# Patient Record
Sex: Female | Born: 1955 | Race: Black or African American | Hispanic: No | Smoking: Former smoker
Health system: Southern US, Community
[De-identification: ages and names within clinical notes are randomized; demographics above are authoritative.]

## PROBLEM LIST (undated history)

## (undated) DIAGNOSIS — N62 Hypertrophy of breast: Secondary | ICD-10-CM

## (undated) DIAGNOSIS — F32A Depression, unspecified: Secondary | ICD-10-CM

## (undated) DIAGNOSIS — F329 Major depressive disorder, single episode, unspecified: Secondary | ICD-10-CM

## (undated) DIAGNOSIS — N950 Postmenopausal bleeding: Secondary | ICD-10-CM

## (undated) DIAGNOSIS — K449 Diaphragmatic hernia without obstruction or gangrene: Secondary | ICD-10-CM

## (undated) DIAGNOSIS — E669 Obesity, unspecified: Secondary | ICD-10-CM

## (undated) DIAGNOSIS — G4730 Sleep apnea, unspecified: Secondary | ICD-10-CM

## (undated) DIAGNOSIS — K219 Gastro-esophageal reflux disease without esophagitis: Secondary | ICD-10-CM

## (undated) HISTORY — PX: GASTRIC RESTRICTION SURGERY: SHX653

## (undated) HISTORY — PX: ENDOMETRIAL ABLATION: SHX621

## (undated) HISTORY — PX: LAPAROSCOPIC PARTIAL PROTECTOMY: SHX5912

## (undated) HISTORY — PX: NASAL SINUS SURGERY: SHX719

## (undated) HISTORY — PX: OTHER SURGICAL HISTORY: SHX169

## (undated) HISTORY — PX: BARIATRIC SURGERY: SHX1103

## (undated) HISTORY — PX: COLON SURGERY: SHX602

## (undated) HISTORY — PX: TUBAL LIGATION: SHX77

---

## 2004-07-29 ENCOUNTER — Ambulatory Visit: Payer: Self-pay | Admitting: Otolaryngology

## 2006-08-08 ENCOUNTER — Ambulatory Visit: Payer: Self-pay | Admitting: Nurse Practitioner

## 2006-11-21 ENCOUNTER — Ambulatory Visit: Payer: Self-pay | Admitting: Unknown Physician Specialty

## 2007-09-07 ENCOUNTER — Ambulatory Visit: Payer: Self-pay | Admitting: Nurse Practitioner

## 2008-09-09 ENCOUNTER — Ambulatory Visit: Payer: Self-pay | Admitting: Nurse Practitioner

## 2008-09-18 ENCOUNTER — Ambulatory Visit: Payer: Self-pay | Admitting: Nurse Practitioner

## 2008-10-02 ENCOUNTER — Ambulatory Visit: Payer: Self-pay | Admitting: Nurse Practitioner

## 2009-12-16 ENCOUNTER — Ambulatory Visit: Payer: Self-pay

## 2012-03-20 ENCOUNTER — Ambulatory Visit: Payer: Self-pay

## 2012-03-25 ENCOUNTER — Ambulatory Visit: Payer: Self-pay | Admitting: Family Medicine

## 2013-10-16 ENCOUNTER — Ambulatory Visit: Payer: Self-pay | Admitting: Family Medicine

## 2013-10-16 IMAGING — US US PELV - US TRANSVAGINAL
1 series · 13 of 25 positions shown · non-contrast
Comparison: None

CLINICAL DATA: Question intrauterine device.



[Series 1: us pelv - us transvaginal · 0.29mm/px · 13 of 66 slices shown]
[im 1/66]
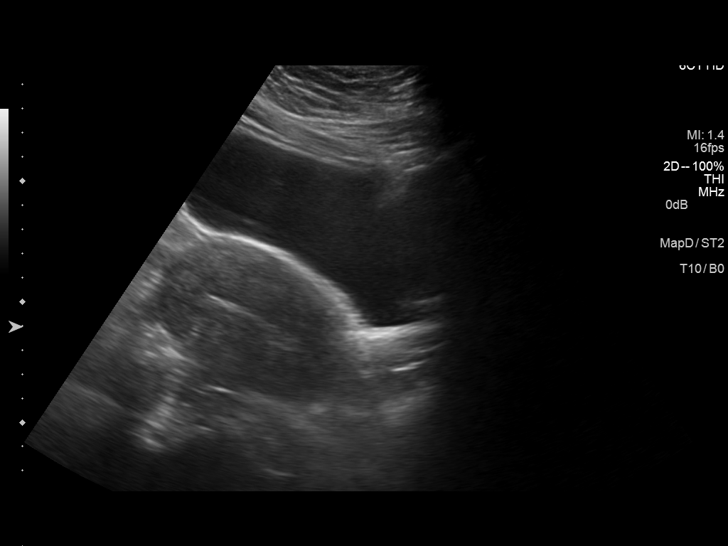
[im 6/66]
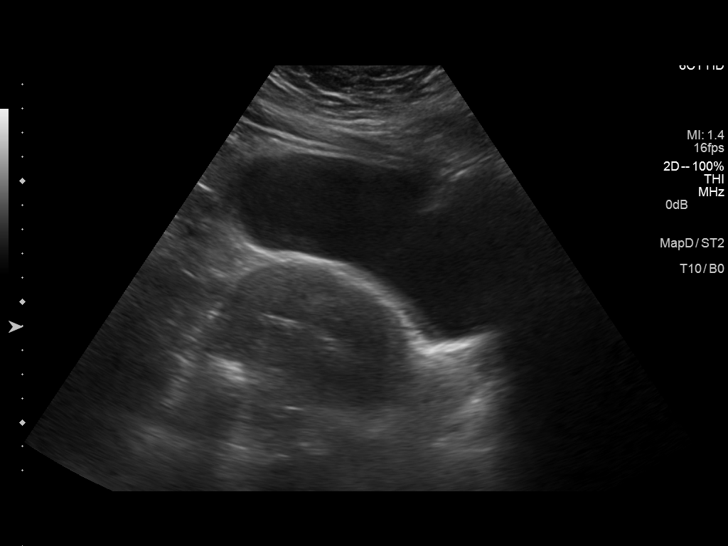
[im 11/66]
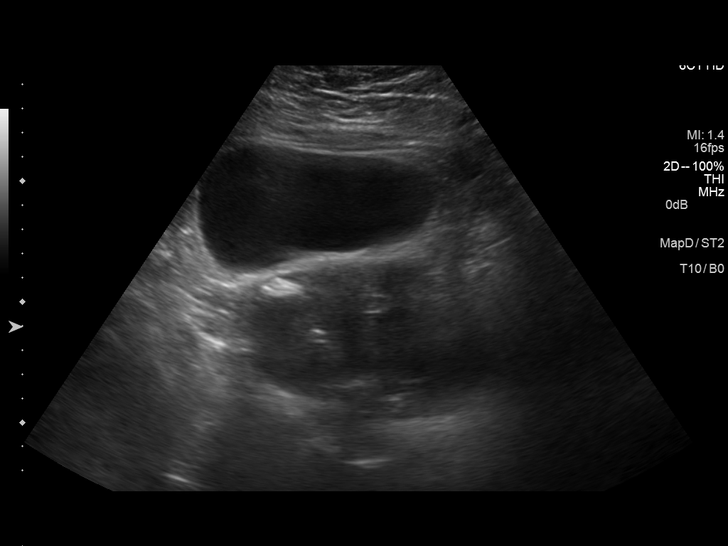
[im 17/66]
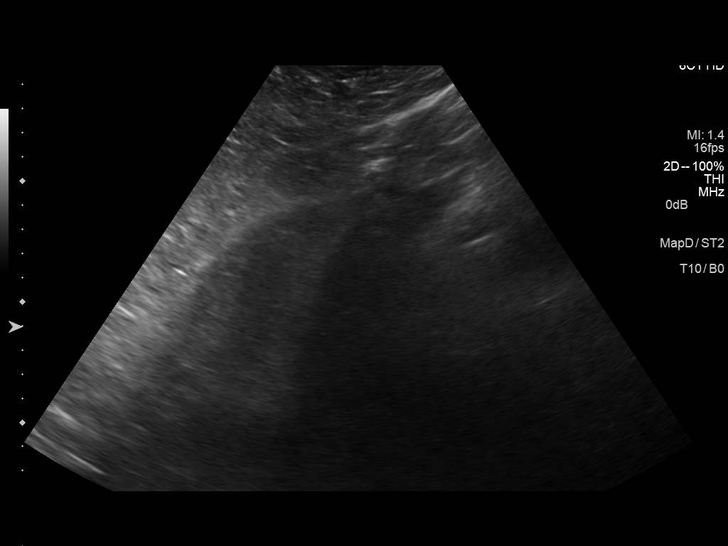
[im 22/66]
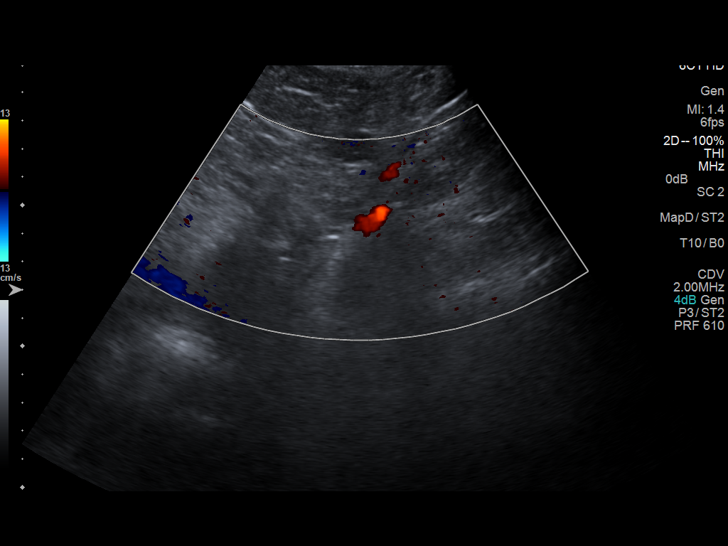
[im 28/66]
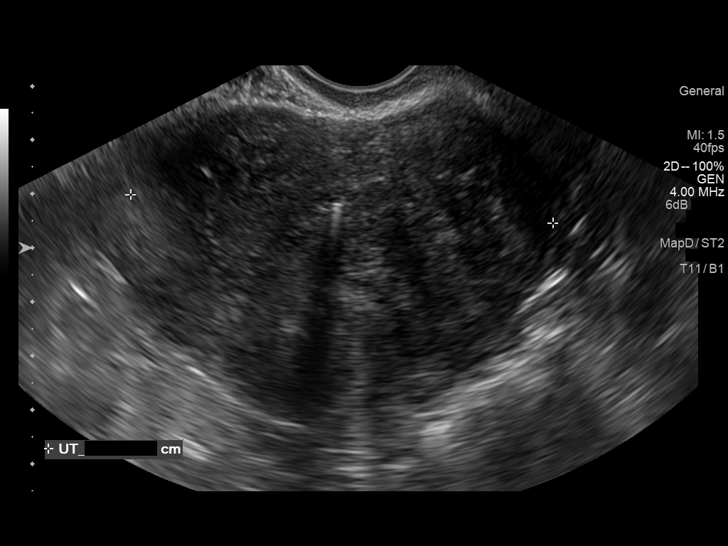
[im 33/66]
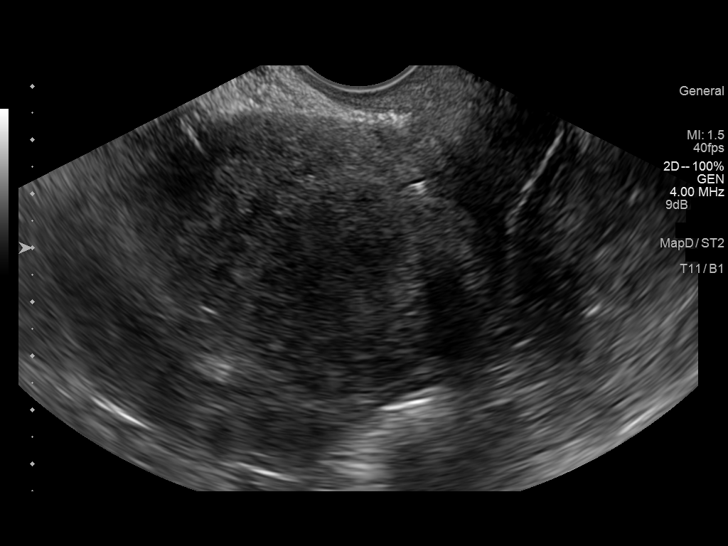
[im 38/66]
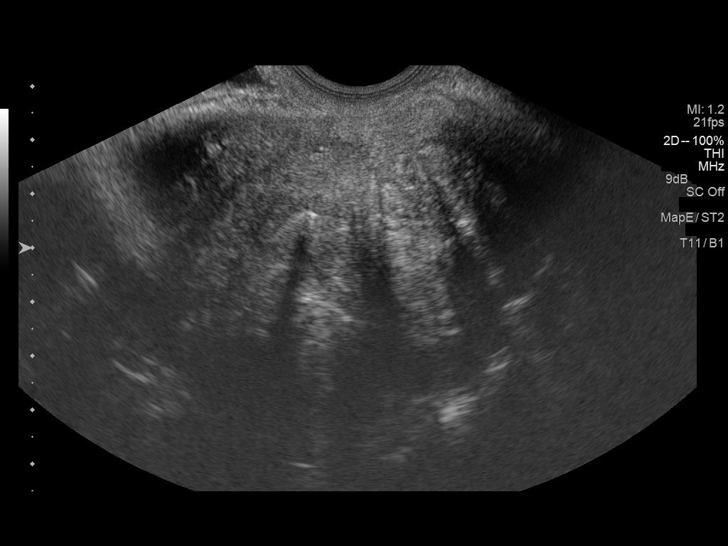
[im 44/66]
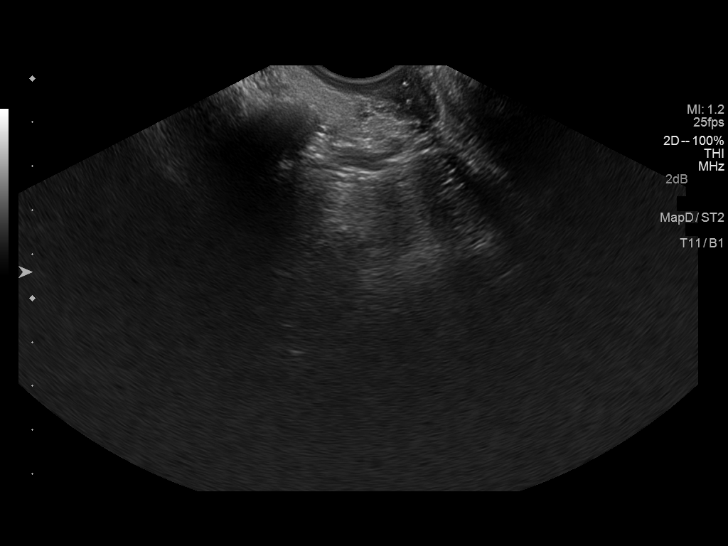
[im 49/66]
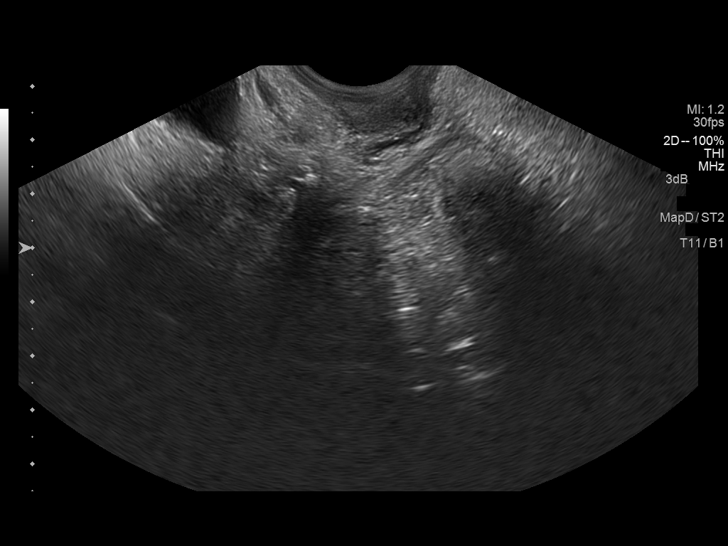
[im 55/66]
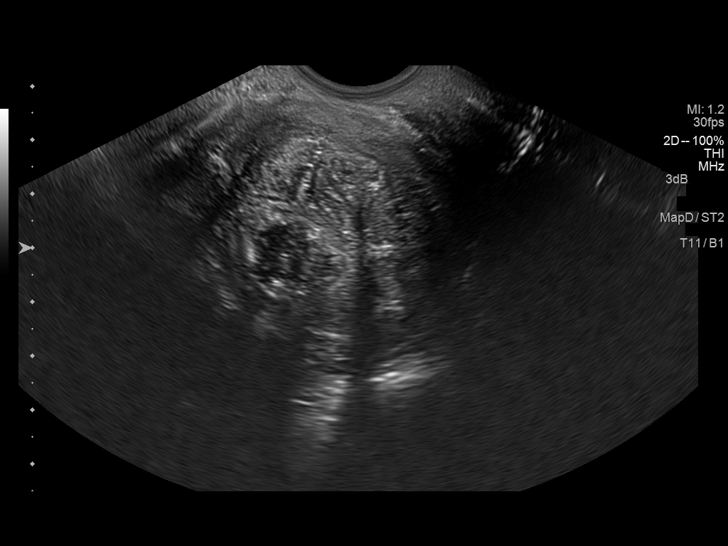
[im 60/66]
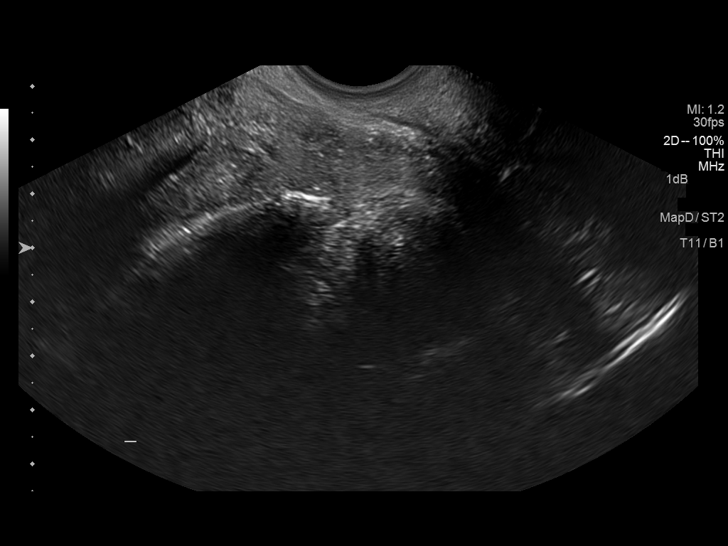
[im 66/66]
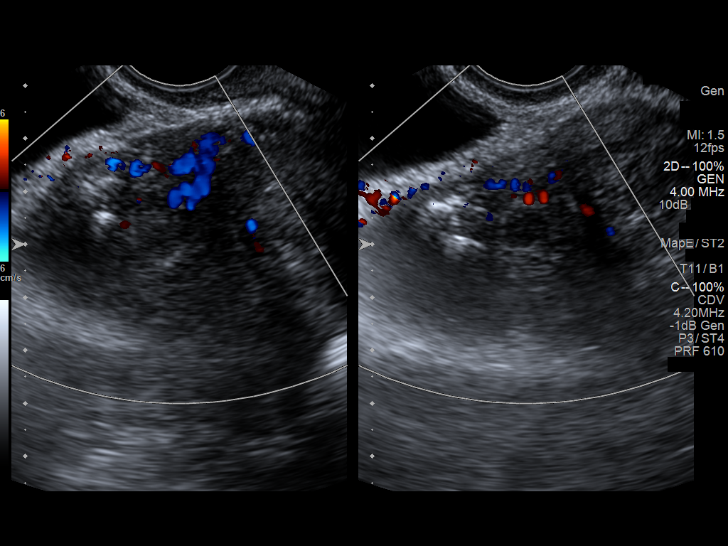

[13 of 25 positions shown; findings below may reference images not displayed]

FINDINGS: Uterus

Measurements: 10.2 x 6.3 x 7.8 cm. Within the superior left aspect
of the uterine body/ fundus there is a 3.8 x 2.9 x 2.8 cm mixed
echogenicity fibroid. Within the right aspect of the uterine fundus
there is a 1.4 x 1.1 x 0.9 cm mixed echogenicity fibroid, partially
calcified. Complicated nabothian cyst.

Endometrium

Thickness: 6.4 mm. Intrauterine device is visualized however unable
to assess for appropriate positioning secondary to large fundal
fibroids.

Right ovary

Not visualized

Left ovary

Not visualized

Other findings

No free fluid.
IMPRESSION: Intrauterine device is present. Limited evaluation due to body
habitus and multiple large fibroids. The superior portion of the
intrauterine device is not able to be visualized and therefore
unable to assess for correct positioning.

Fibroid uterus.

## 2013-10-26 ENCOUNTER — Ambulatory Visit: Payer: Self-pay | Admitting: Bariatrics

## 2013-10-26 IMAGING — CR DG CHEST 2V
1 series · 2 of 2 positions shown · non-contrast
Comparison: None.

CLINICAL DATA: Morbid obesity

EXAM:
CHEST  2 VIEW

[Series 1: dxr chest pa (or ap) and lateral · 0.14mm/px · 2 of 2 slices shown]
[im 1/2]
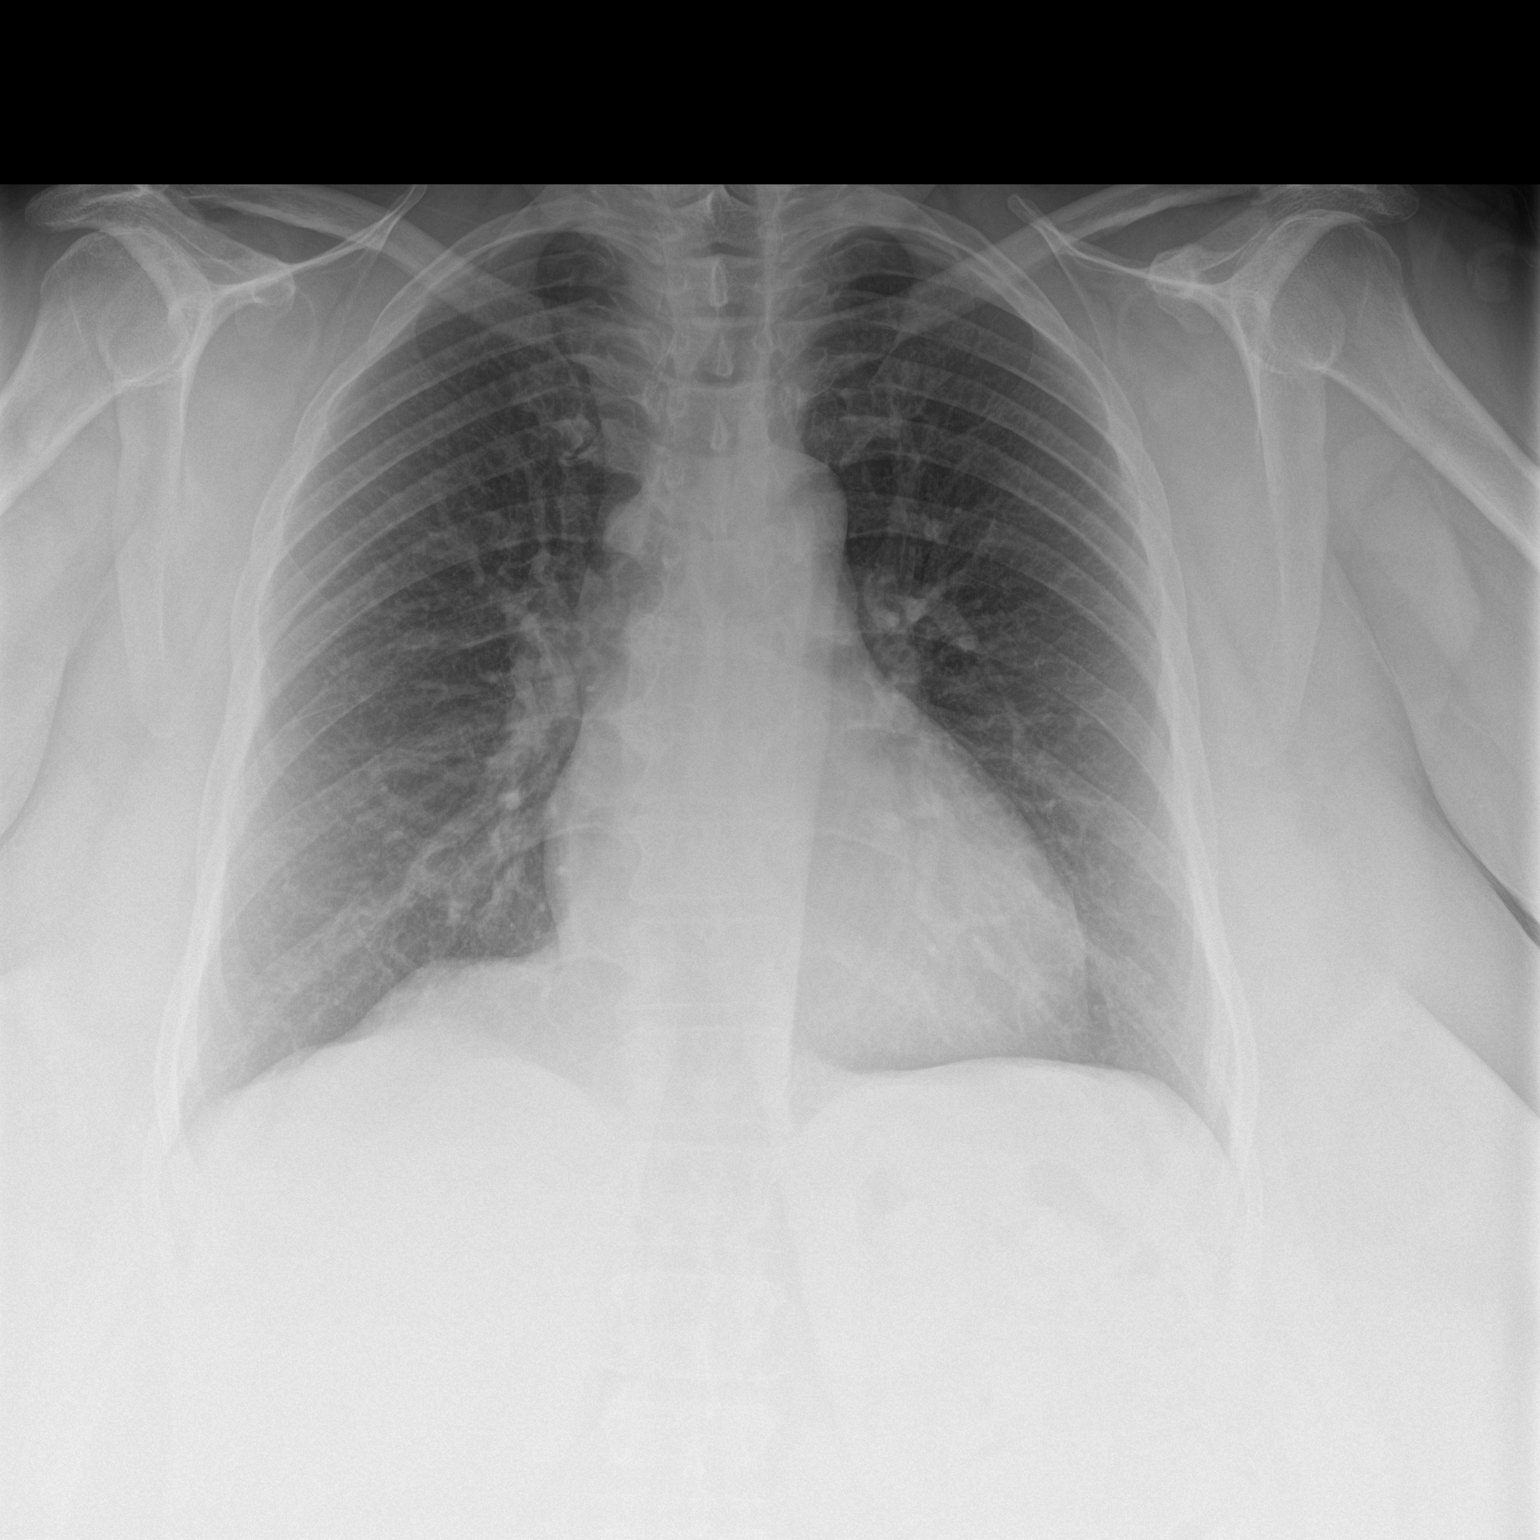
[im 2/2]
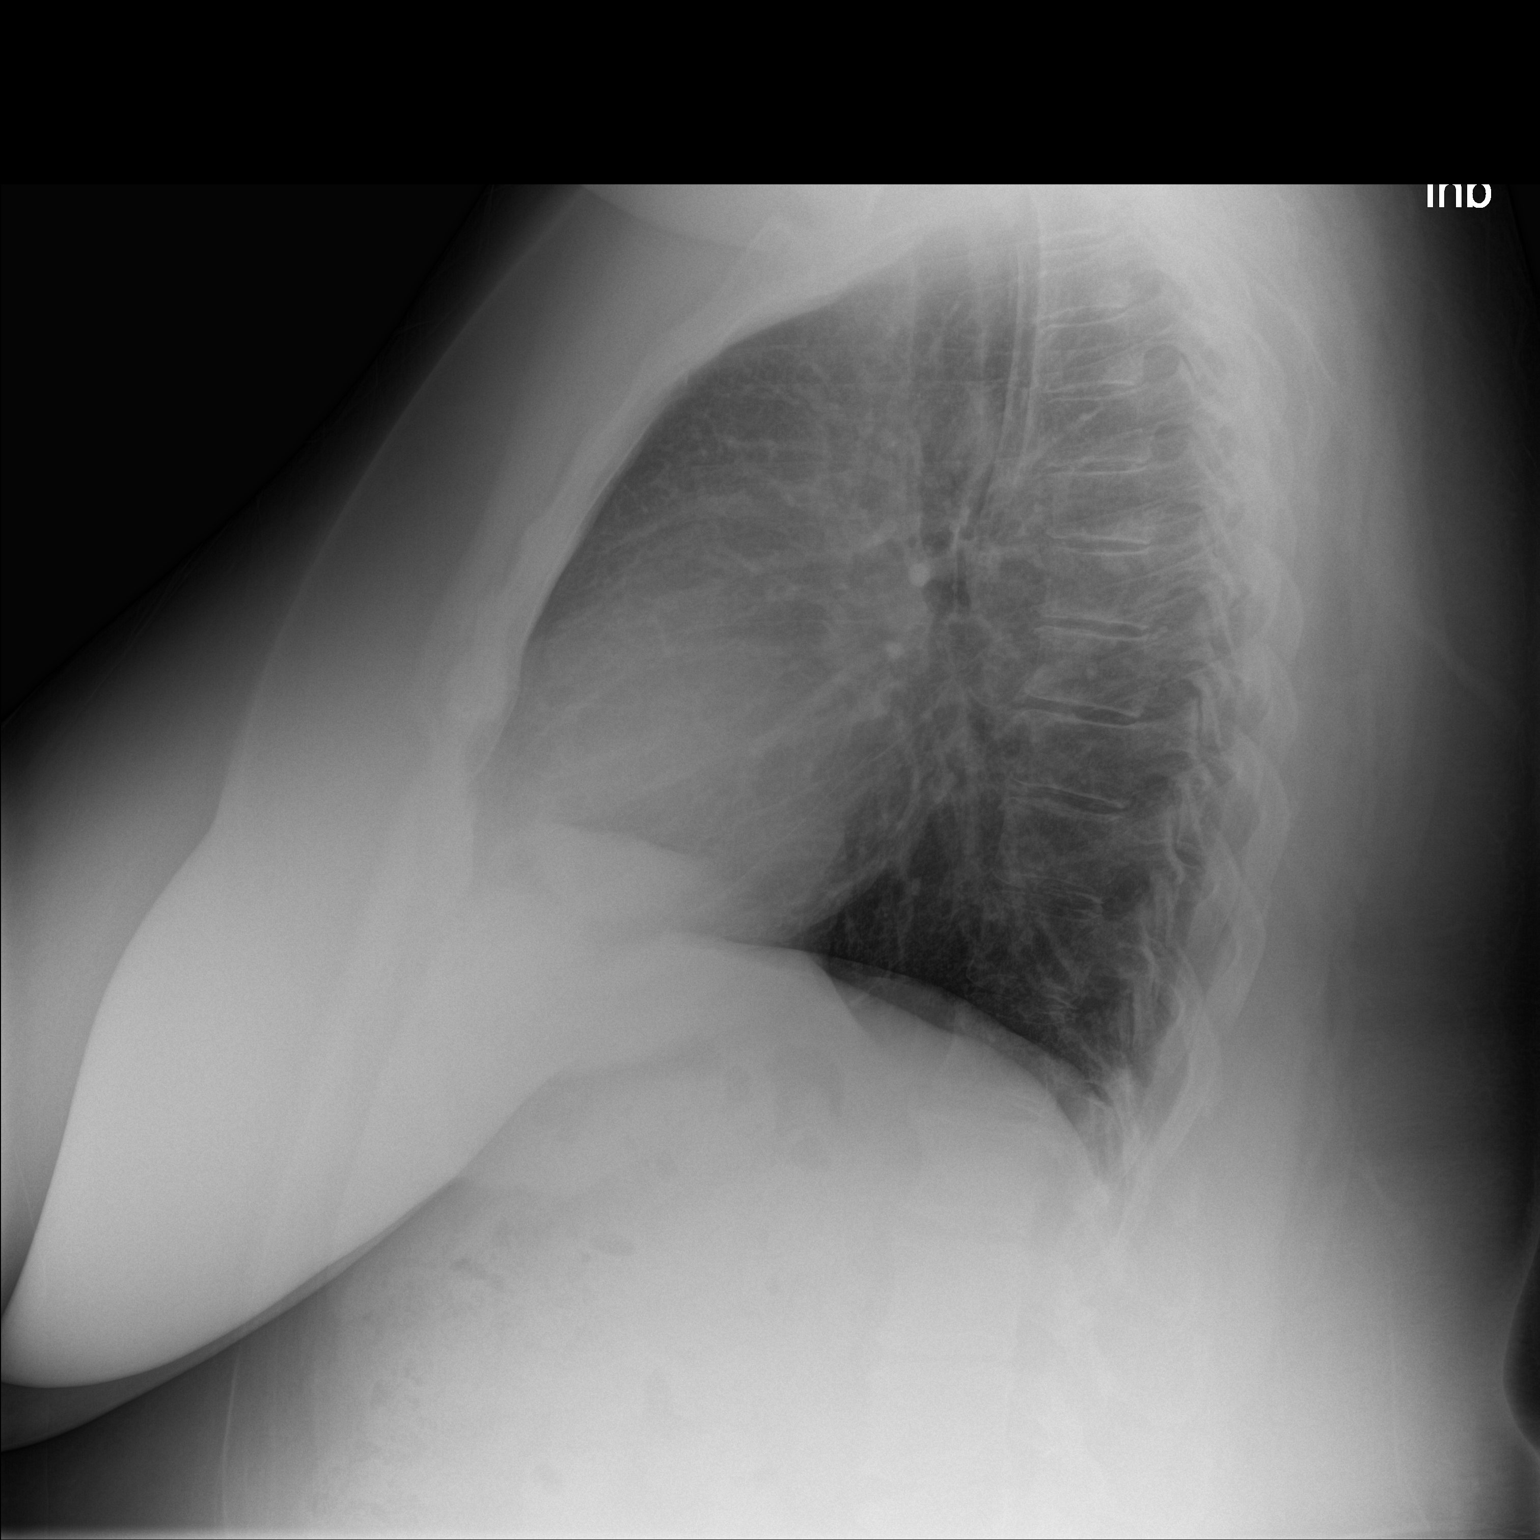

[2 of 2 positions shown; findings below may reference images not displayed]

FINDINGS: The lungs are well-expanded and clear. The heart and pulmonary
vascularity are normal. There is no pleural effusion. The
mediastinum is normal in width. The bony thorax is unremarkable.
IMPRESSION: There is no acute cardiopulmonary abnormality.

## 2013-11-06 ENCOUNTER — Ambulatory Visit: Payer: Self-pay | Admitting: Gastroenterology

## 2013-11-06 ENCOUNTER — Ambulatory Visit: Payer: Self-pay | Admitting: Bariatrics

## 2013-11-06 IMAGING — US ABDOMEN ULTRASOUND LIMITED
1 series · 14 of 25 positions shown · non-contrast
Comparison: None.

CLINICAL DATA: Morbid obesity.  Right upper quadrant pain.  GERD.

EXAM:
US ABDOMEN LIMITED - RIGHT UPPER QUADRANT

[Series 1: abdomen ultrasound limited · 0.27mm/px · 14 of 42 slices shown]
[im 1/42]
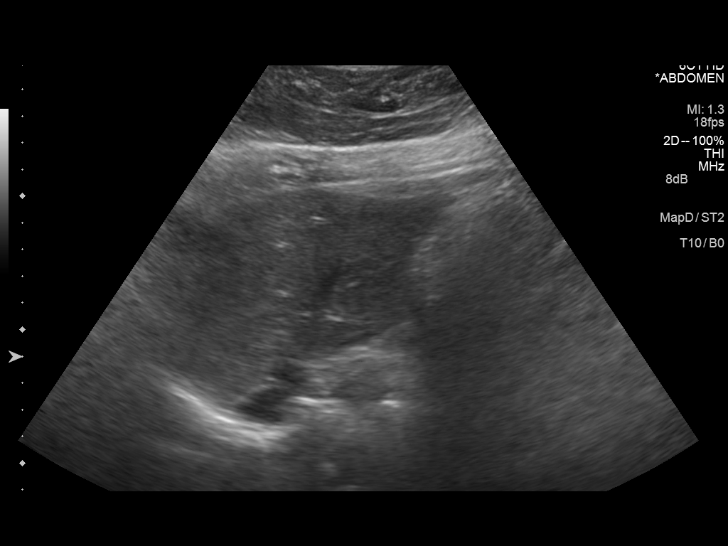
[im 4/42]
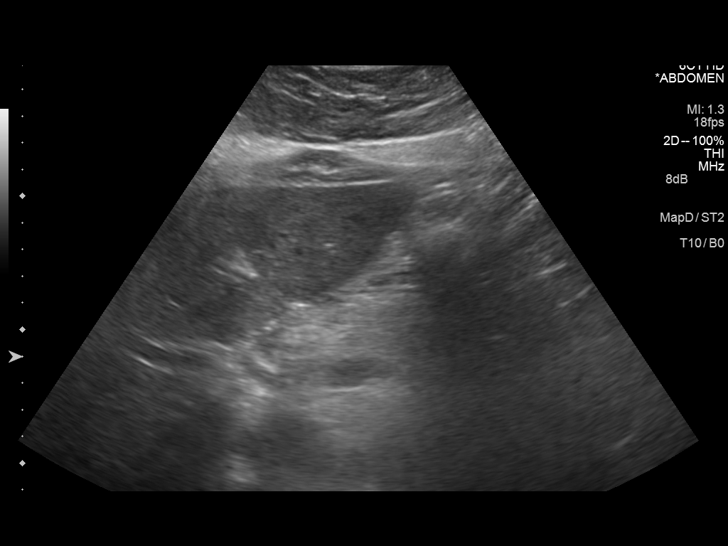
[im 7/42]
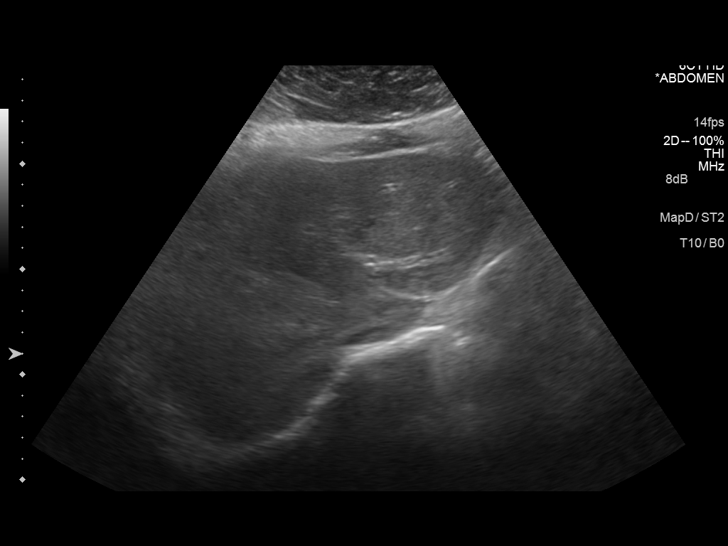
[im 11/42]
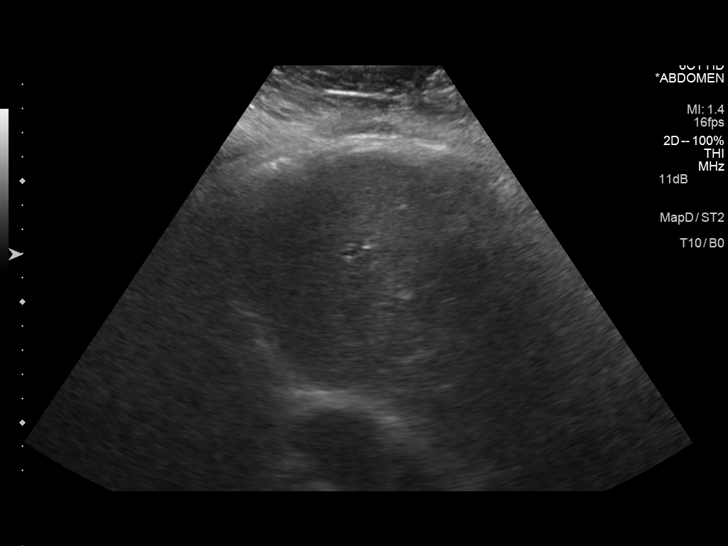
[im 14/42]
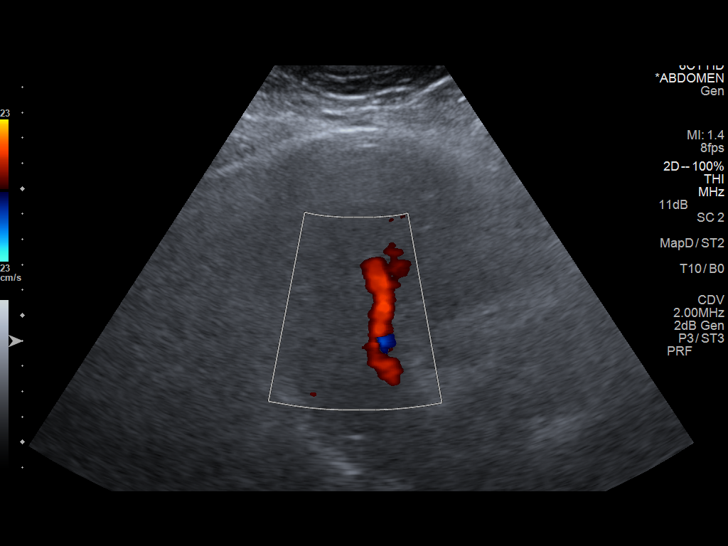
[im 16/42]
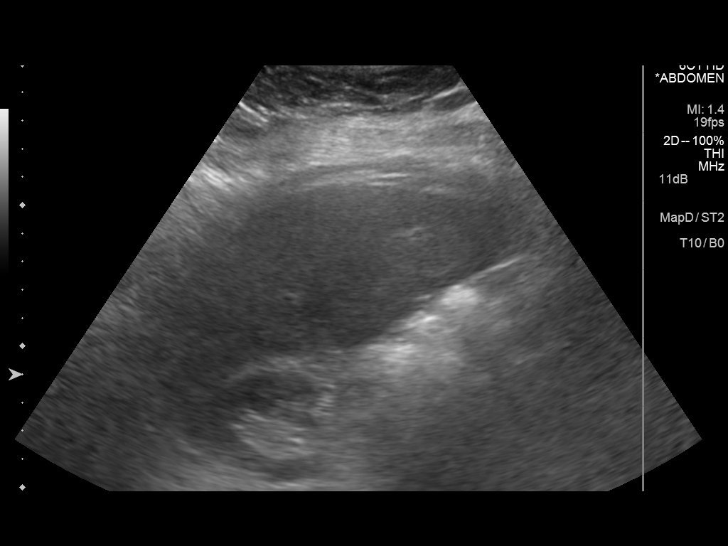
[im 19/42]
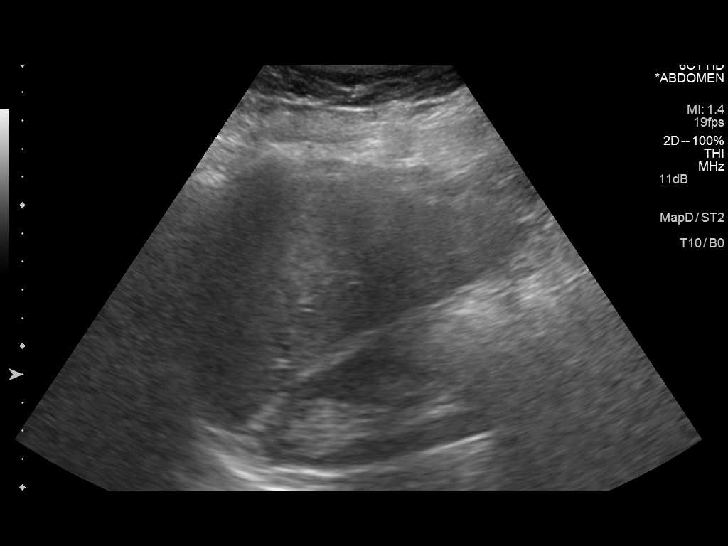
[im 23/42]
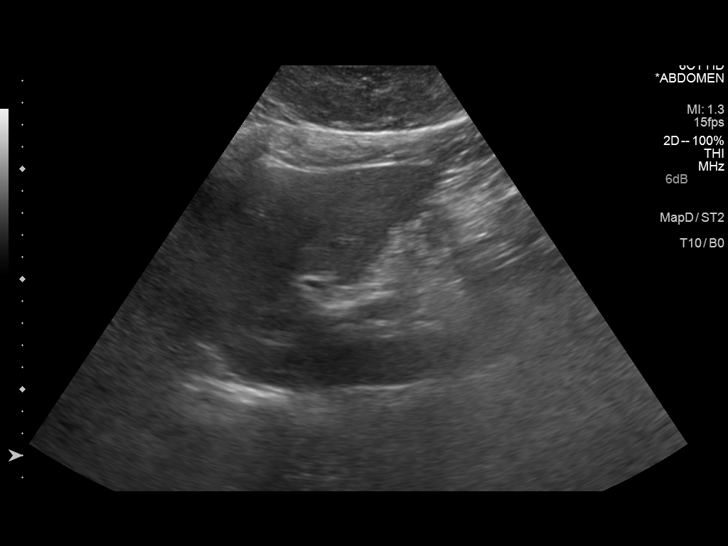
[im 26/42]
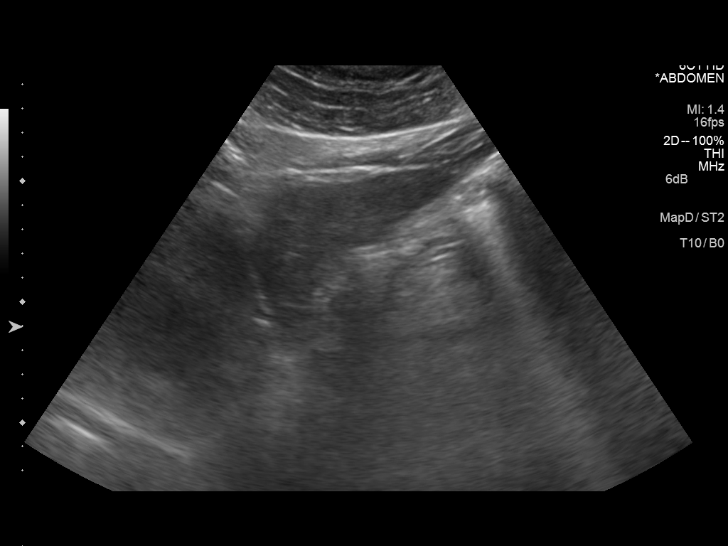
[im 28/42]
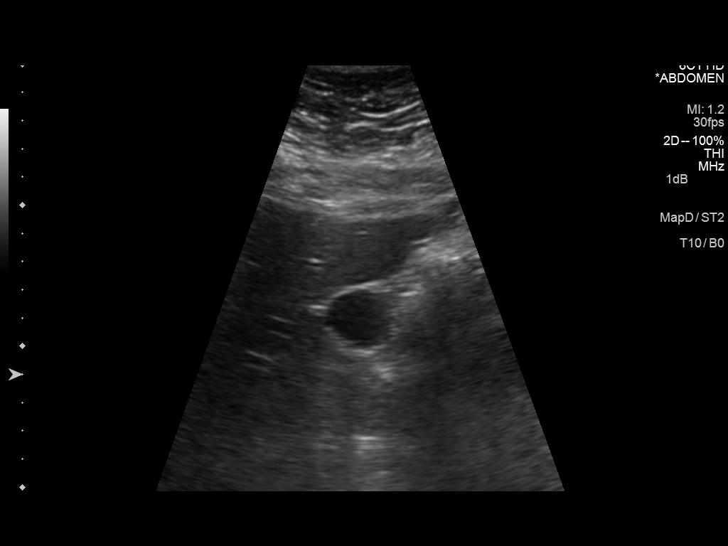
[im 31/42]
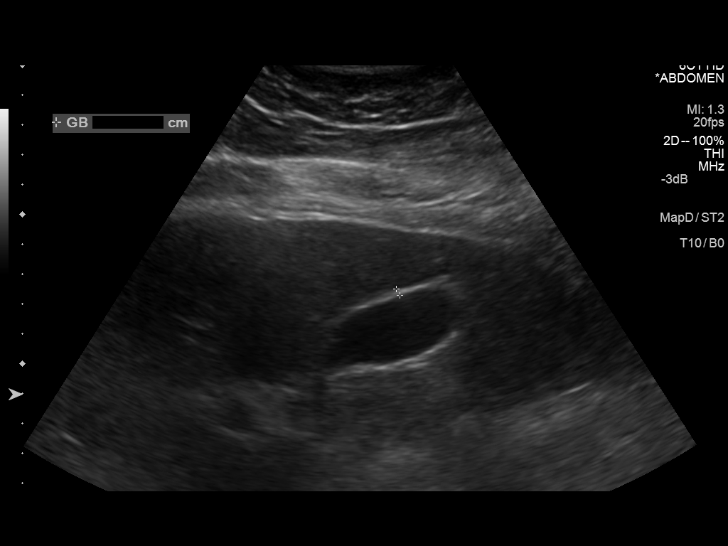
[im 35/42]
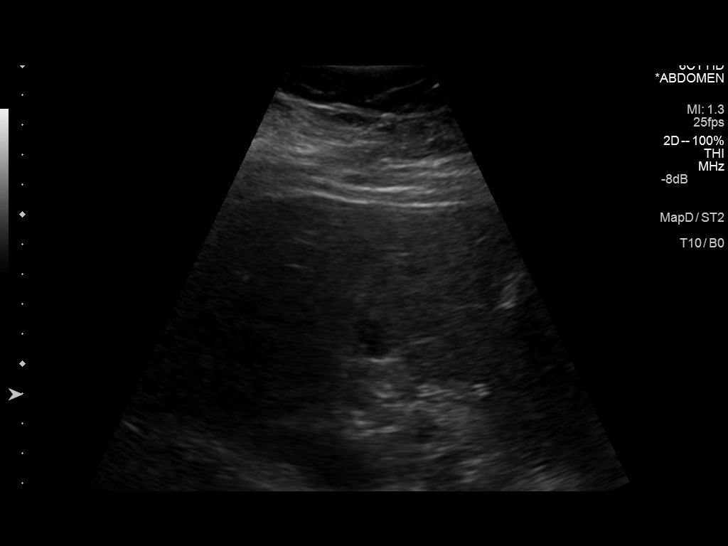
[im 38/42]
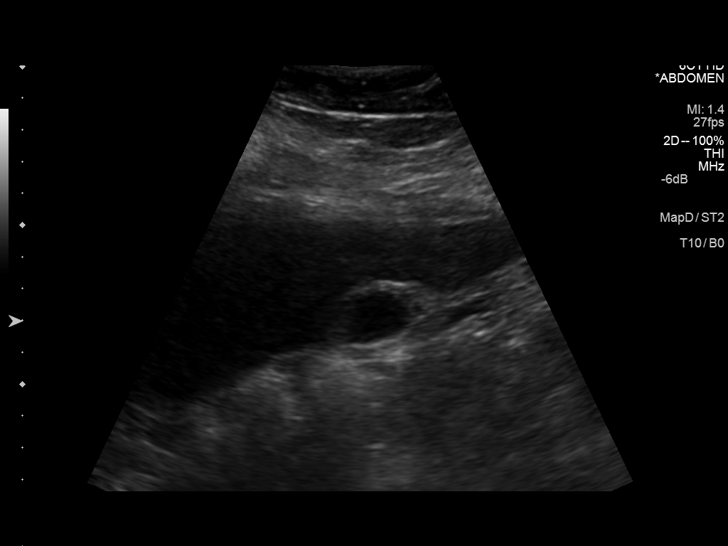
[im 42/42]
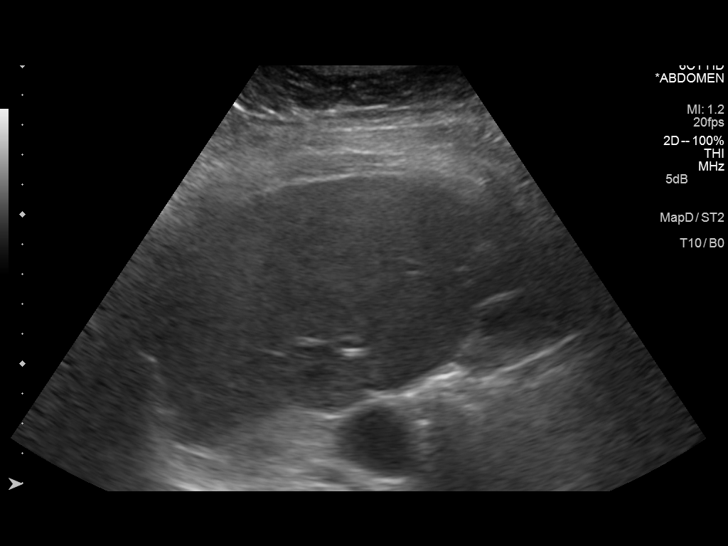

[14 of 25 positions shown; findings below may reference images not displayed]

FINDINGS: Gallbladder:

No gallstones or wall thickening visualized. No sonographic Murphy
sign noted.

Common bile duct:

Diameter: 2.5 mm.

Liver:

Mild hepatic steatosis without focal mass.
IMPRESSION: No acute hepatobiliary findings.

Mild hepatic steatosis.

## 2015-06-28 ENCOUNTER — Encounter: Payer: Self-pay | Admitting: Gynecology

## 2015-06-28 ENCOUNTER — Ambulatory Visit
Admission: EM | Admit: 2015-06-28 | Discharge: 2015-06-28 | Disposition: A | Discharge status: Home / Self Care | Payer: BLUE CROSS/BLUE SHIELD | Attending: Family Medicine | Admitting: Family Medicine

## 2015-06-28 DIAGNOSIS — J0100 Acute maxillary sinusitis, unspecified: Secondary | ICD-10-CM | POA: Diagnosis not present

## 2015-06-28 DIAGNOSIS — H6501 Acute serous otitis media, right ear: Secondary | ICD-10-CM | POA: Diagnosis not present

## 2015-06-28 HISTORY — DX: Gastro-esophageal reflux disease without esophagitis: K21.9

## 2015-06-28 HISTORY — DX: Depression, unspecified: F32.A

## 2015-06-28 HISTORY — DX: Obesity, unspecified: E66.9

## 2015-06-28 HISTORY — DX: Postmenopausal bleeding: N95.0

## 2015-06-28 HISTORY — DX: Sleep apnea, unspecified: G47.30

## 2015-06-28 HISTORY — DX: Major depressive disorder, single episode, unspecified: F32.9

## 2015-06-28 HISTORY — DX: Hypertrophy of breast: N62

## 2015-06-28 HISTORY — DX: Diaphragmatic hernia without obstruction or gangrene: K44.9

## 2015-06-28 MED ORDER — AMOXICILLIN 875 MG PO TABS: 875.0000 mg | ORAL_TABLET | Freq: Two times a day (BID) | ORAL | Status: DC

## 2015-06-28 NOTE — Discharge Instructions (Signed)

## 2015-06-28 NOTE — ED Provider Notes (Signed)
CSN: [id number]     Arrival date & time 06/28/15  1258 History   First MD Initiated Contact with Patient 06/28/15 1345     Chief Complaint  Patient presents with  . URI   (Consider location/radiation/quality/duration/timing/severity/associated sxs/prior Treatment) Patient is a 60 y.o. female presenting with URI. The history is provided by the patient.  URI Presenting symptoms: cough, ear pain, fatigue and fever   Severity:  Moderate Onset quality:  Sudden Duration:  2 weeks Timing:  Constant Progression:  Unchanged Chronicity:  New Ineffective treatments:  OTC medications Associated symptoms: headaches and sinus pain   Associated symptoms: no neck pain, no swollen glands and no wheezing   Risk factors: sick contacts   Risk factors: not elderly, no chronic cardiac disease, no chronic kidney disease, no chronic respiratory disease, no diabetes mellitus, no immunosuppression, no recent illness and no recent travel     Past Medical History  Diagnosis Date  . Macromastia   . Obesity   . Depressive disorder   . Post-menopausal bleeding   . Sleep apnea   . GERD (gastroesophageal reflux disease)   . Hiatal hernia    Past Surgical History  Procedure Laterality Date  . Buionectomy    . Nasal sinus surgery    . Endometrial ablation    . Colon surgery    . Uge    . Tubal ligation    . Laparoscopic partial protectomy    . Gastric restriction surgery    . Gastectomy    . Bariatric surgery     No family history on file. Social History  Substance Use Topics  . Smoking status: Former Research scientist (life sciences)  . Smokeless tobacco: None  . Alcohol Use: Yes   OB History    No data available     Review of Systems  Constitutional: Positive for fever and fatigue.  HENT: Positive for ear pain.   Respiratory: Positive for cough. Negative for wheezing.   Musculoskeletal: Negative for neck pain.  Neurological: Positive for headaches.    Allergies  Latex and Nickel  Home Medications   Prior  to Admission medications   Medication Sig Start Date End Date Taking? Authorizing Provider  ALPRAZolam Duanne Moron) 0.5 MG tablet Take 0.5 mg by mouth at bedtime as needed for anxiety.   Yes Historical Provider, MD  citalopram (CELEXA) 20 MG tablet Take 20 mg by mouth daily.   Yes Historical Provider, MD  fluticasone (FLONASE) 50 MCG/ACT nasal spray Place into both nostrils daily.   Yes Historical Provider, MD  amoxicillin (AMOXIL) 875 MG tablet Take 1 tablet (875 mg total) by mouth 2 (two) times daily. 06/28/15   Norval Gable, MD   Meds Ordered and Administered this Visit  Medications - No data to display  BP 118/73 mmHg  Pulse 64  Temp(Src) 98.7 F (37.1 C) (Oral)  Resp 18  Ht 5\' 4"  (1.626 m)  Wt 230 lb (104.327 kg)  BMI 39.46 kg/m2  SpO2 99% No data found.   Physical Exam  Constitutional: She appears well-developed and well-nourished. No distress.  HENT:  Head: Normocephalic and atraumatic.  Right Ear: External ear and ear canal normal. Tympanic membrane is injected, erythematous and bulging.  Left Ear: Tympanic membrane, external ear and ear canal normal.  Nose: Mucosal edema and rhinorrhea present. No nose lacerations, sinus tenderness, nasal deformity, septal deviation or nasal septal hematoma. No epistaxis.  No foreign bodies. Right sinus exhibits maxillary sinus tenderness and frontal sinus tenderness. Left sinus exhibits maxillary sinus tenderness  and frontal sinus tenderness.  Mouth/Throat: Uvula is midline, oropharynx is clear and moist and mucous membranes are normal. No oropharyngeal exudate.  Eyes: Conjunctivae and EOM are normal. Pupils are equal, round, and reactive to light. Right eye exhibits no discharge. Left eye exhibits no discharge. No scleral icterus.  Neck: Normal range of motion. Neck supple. No thyromegaly present.  Cardiovascular: Normal rate, regular rhythm and normal heart sounds.   Pulmonary/Chest: Effort normal and breath sounds normal. No respiratory  distress. She has no wheezes. She has no rales.  Lymphadenopathy:    She has no cervical adenopathy.  Skin: She is not diaphoretic.  Nursing note and vitals reviewed.   ED Course  Procedures (including critical care time)  Labs Review Labs Reviewed - No data to display  Imaging Review No results found.   Visual Acuity Review  Right Eye Distance:   Left Eye Distance:   Bilateral Distance:    Right Eye Near:   Left Eye Near:    Bilateral Near:         MDM   1. Acute maxillary sinusitis, recurrence not specified   2. Right acute serous otitis media, recurrence not specified    Discharge Medication List as of 06/28/2015  2:09 PM    START taking these medications   Details  amoxicillin (AMOXIL) 875 MG tablet Take 1 tablet (875 mg total) by mouth 2 (two) times daily., Starting 06/28/2015, Until Discontinued, Normal       1. diagnosis reviewed with patient 2. rx as per orders above; reviewed possible side effects, interactions, risks and benefits  3. Recommend supportive treatment with otc analgesics prn, increased fluids 4. Follow-up prn if symptoms worsen or don't improve    Norval Gable, MD 06/28/15 1423

## 2015-06-28 NOTE — ED Notes (Signed)
Patient c/o cough / drainage down to throat / greenish mucous on and off x 2 weeks.

## 2017-06-10 ENCOUNTER — Ambulatory Visit
Admission: EM | Admit: 2017-06-10 | Discharge: 2017-06-10 | Disposition: A | Discharge status: Home / Self Care | Payer: BLUE CROSS/BLUE SHIELD | Attending: Family Medicine | Admitting: Family Medicine

## 2017-06-10 ENCOUNTER — Encounter: Payer: Self-pay | Admitting: Emergency Medicine

## 2017-06-10 ENCOUNTER — Other Ambulatory Visit: Payer: Self-pay

## 2017-06-10 ENCOUNTER — Ambulatory Visit (INDEPENDENT_AMBULATORY_CARE_PROVIDER_SITE_OTHER): Payer: BLUE CROSS/BLUE SHIELD

## 2017-06-10 DIAGNOSIS — M25562 Pain in left knee: Secondary | ICD-10-CM

## 2017-06-10 DIAGNOSIS — S62346A Nondisplaced fracture of base of fifth metacarpal bone, right hand, initial encounter for closed fracture: Secondary | ICD-10-CM

## 2017-06-10 DIAGNOSIS — W1831XA Fall on same level due to stepping on an object, initial encounter: Secondary | ICD-10-CM | POA: Diagnosis not present

## 2017-06-10 DIAGNOSIS — M25511 Pain in right shoulder: Secondary | ICD-10-CM

## 2017-06-10 DIAGNOSIS — S62366A Nondisplaced fracture of neck of fifth metacarpal bone, right hand, initial encounter for closed fracture: Secondary | ICD-10-CM

## 2017-06-10 DIAGNOSIS — M79641 Pain in right hand: Secondary | ICD-10-CM

## 2017-06-10 DIAGNOSIS — S62616A Displaced fracture of proximal phalanx of right little finger, initial encounter for closed fracture: Secondary | ICD-10-CM

## 2017-06-10 DIAGNOSIS — M25531 Pain in right wrist: Secondary | ICD-10-CM

## 2017-06-10 IMAGING — CR DG HAND COMPLETE 3+V*R*
3 series · 3 of 3 positions shown · non-contrast
Comparison: None.

CLINICAL DATA: Acute RIGHT hand pain following fall today. Initial
encounter.

EXAM:
RIGHT HAND - COMPLETE 3+ VIEW

[hand ap]
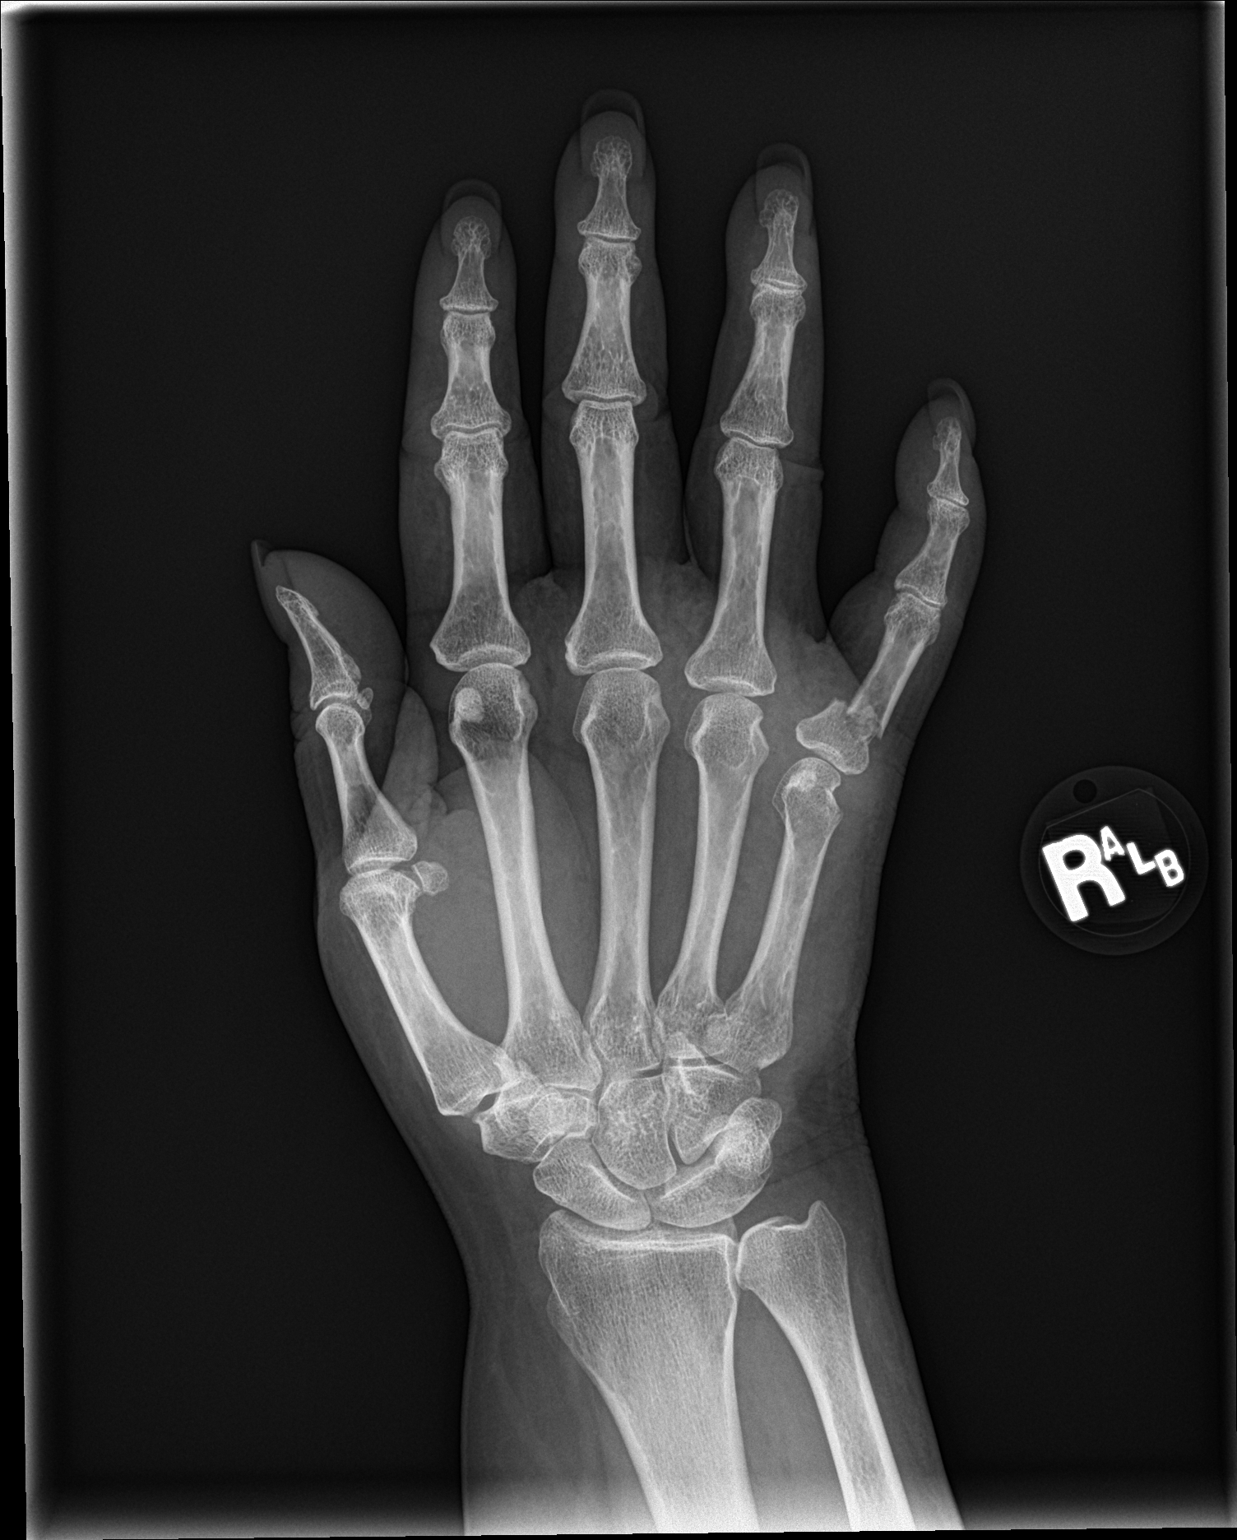

[hand obl]
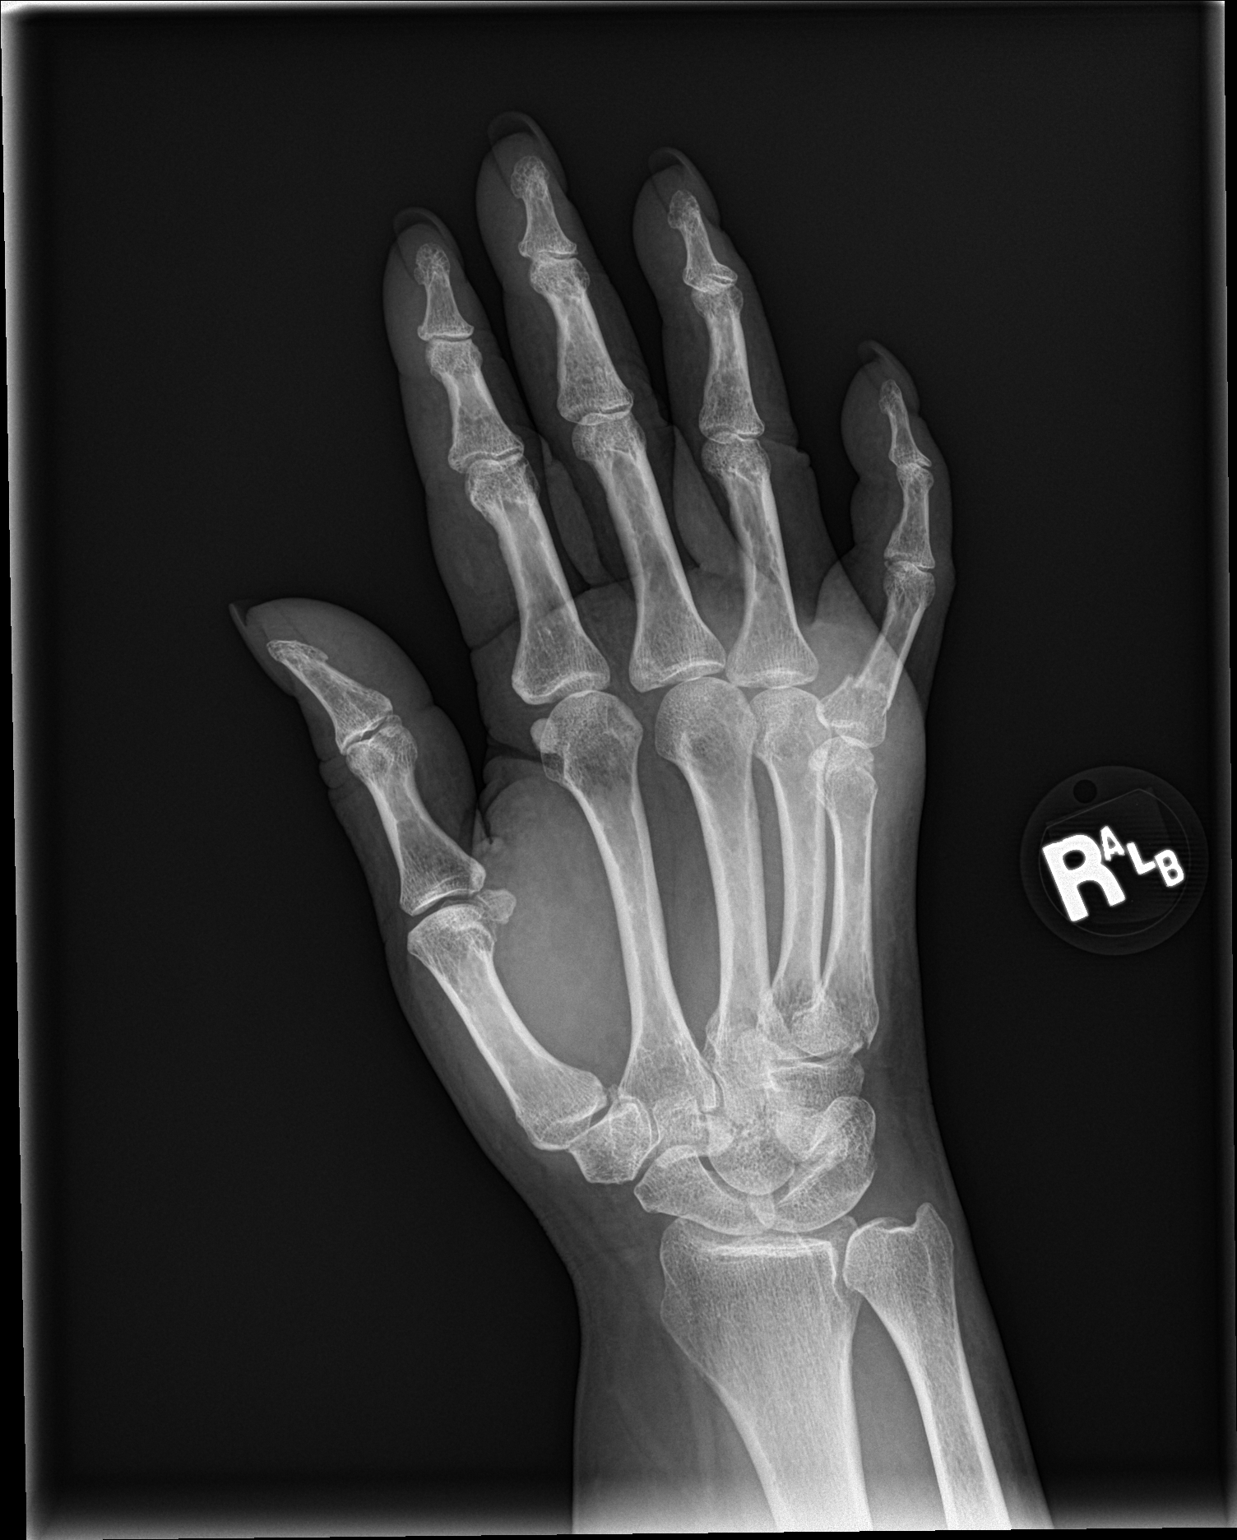

[hand lat]
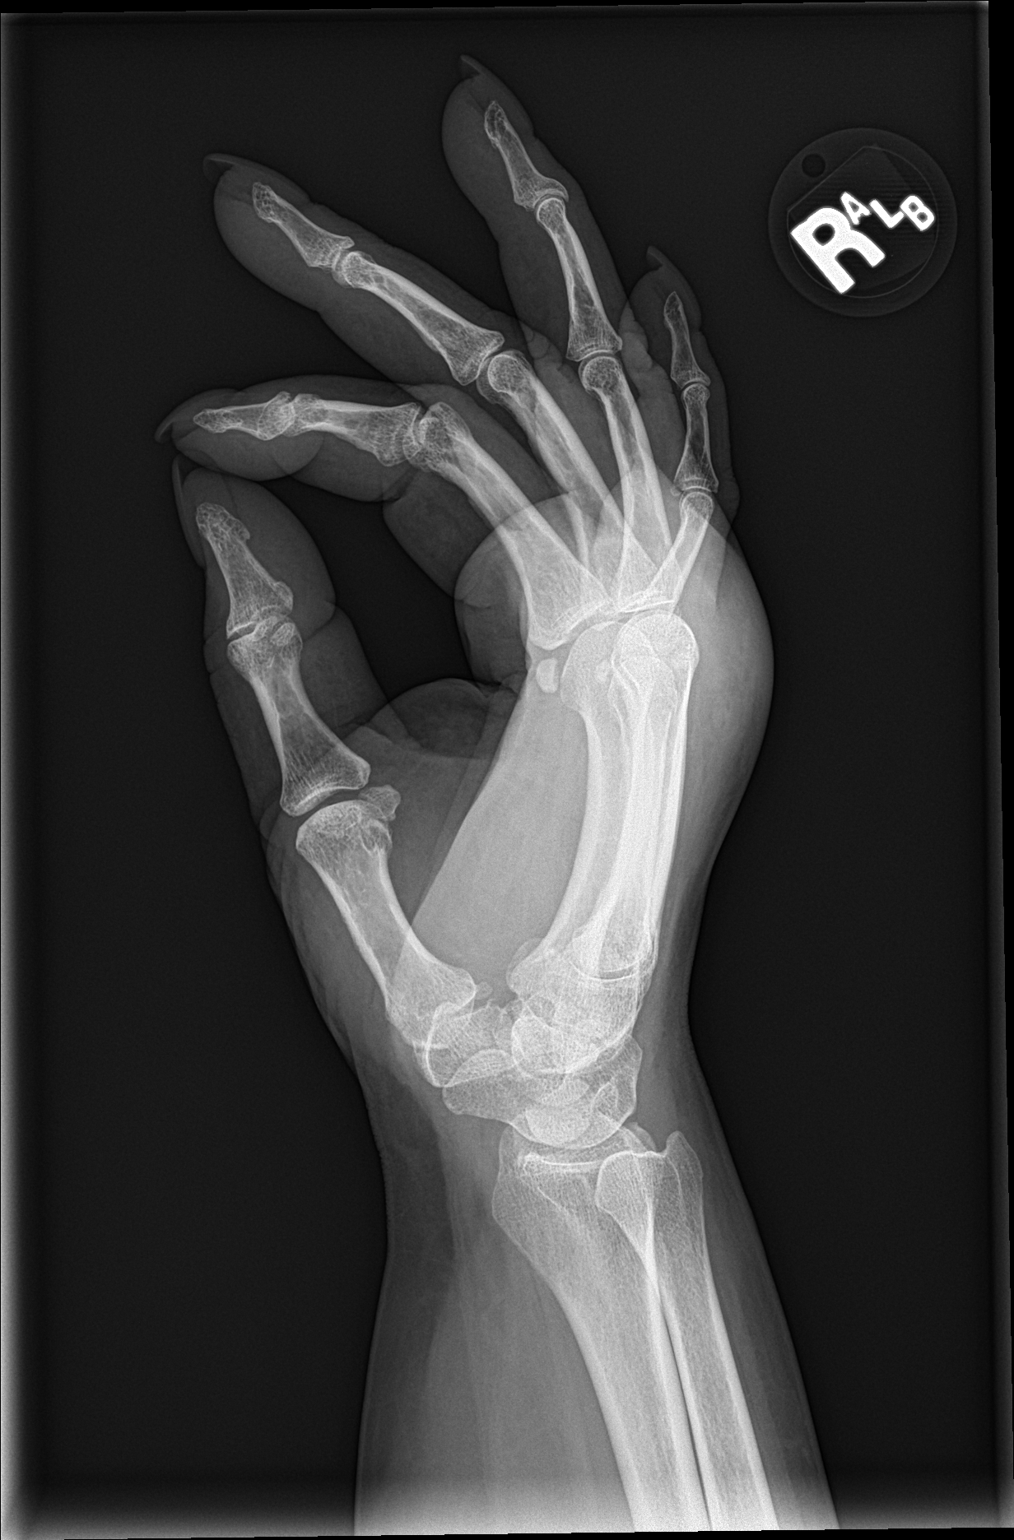

[3 of 3 positions shown; findings below may reference images not displayed]

FINDINGS: A transverse fracture of the proximal aspect of the little finger
proximal phalanx noted with equivocal extension to the MCP joint. 2
mm ulnar displacement noted.

Dorsal soft tissue swelling noted.

An equivocal fracture of the fifth metacarpal head noted.

No subluxation or dislocation noted.

Dorsal soft tissue swelling is present.
IMPRESSION: Fracture of the little finger proximal phalanx as described and
probable fracture of the fifth metacarpal head.

## 2017-06-10 IMAGING — CR DG SHOULDER 2+V*R*
3 series · 4 of 4 positions shown · non-contrast
Comparison: None.

CLINICAL DATA: Acute RIGHT shoulder pain following fall. Initial
encounter.

EXAM:
RIGHT SHOULDER - 2+ VIEW

[Series 1: shoulder grashey · 0.14mm/px · 2 of 2 slices shown]
[im 1/2]
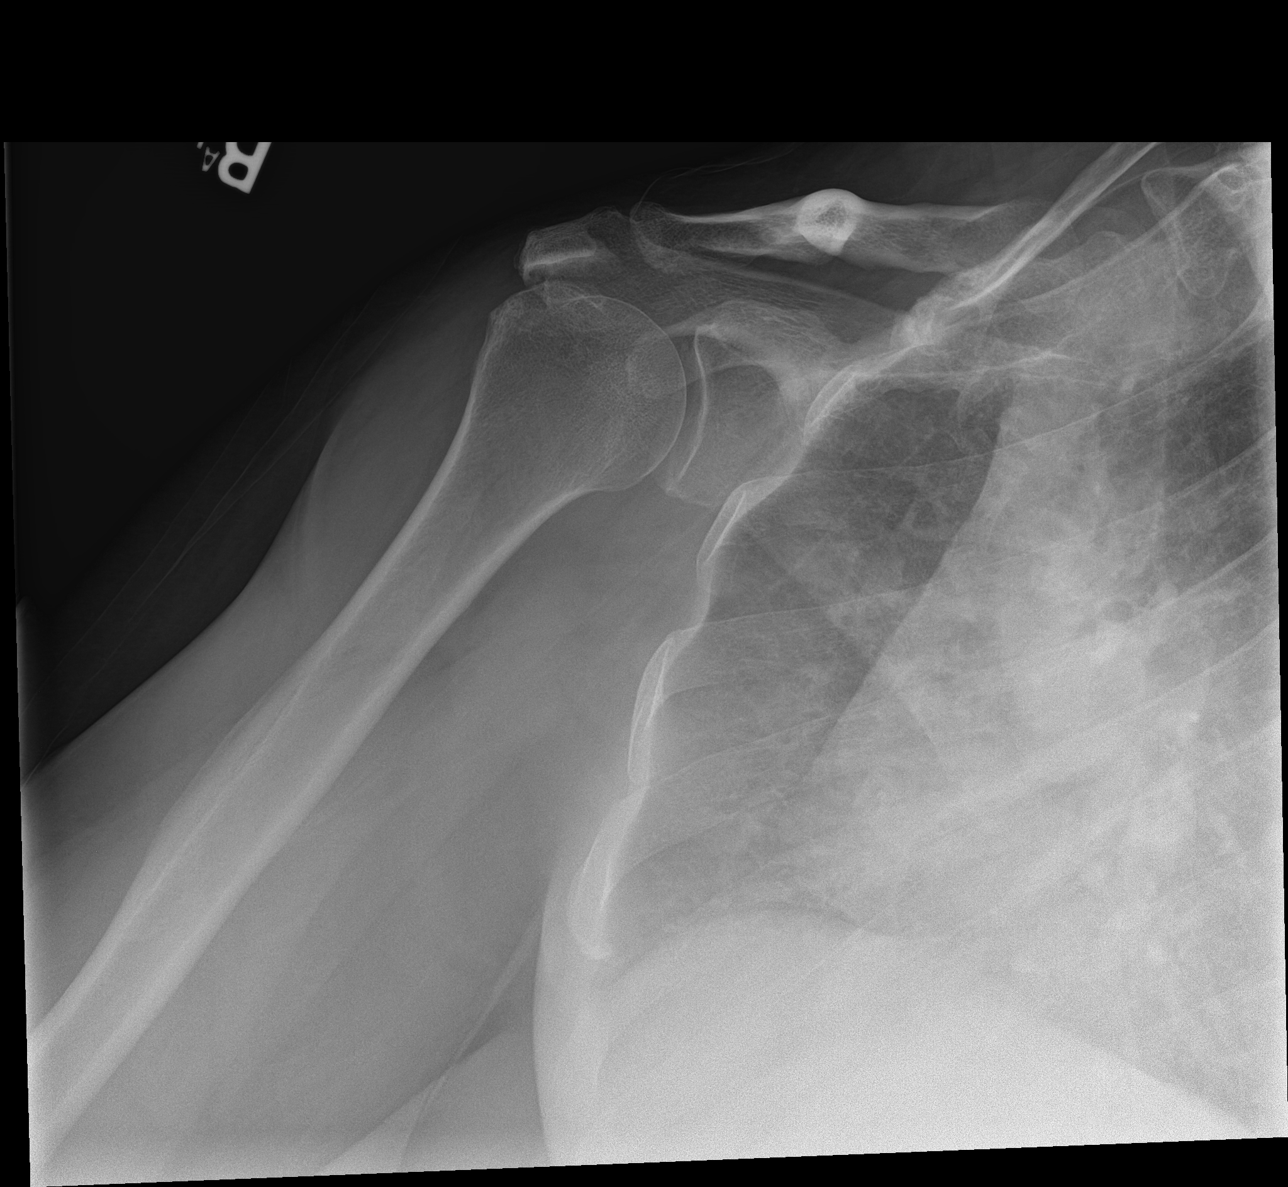
[im 2/2]
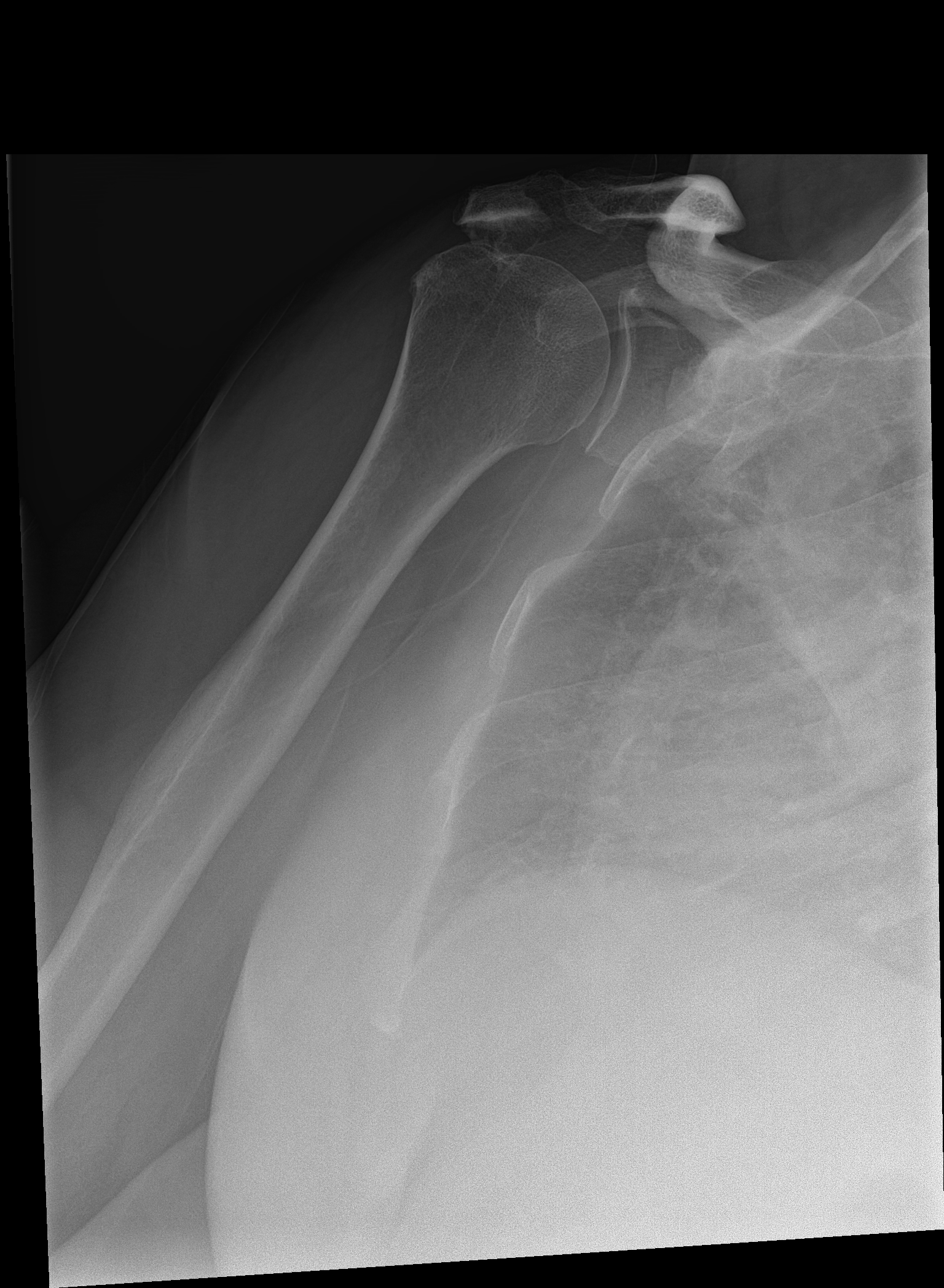

[shoulder y view]
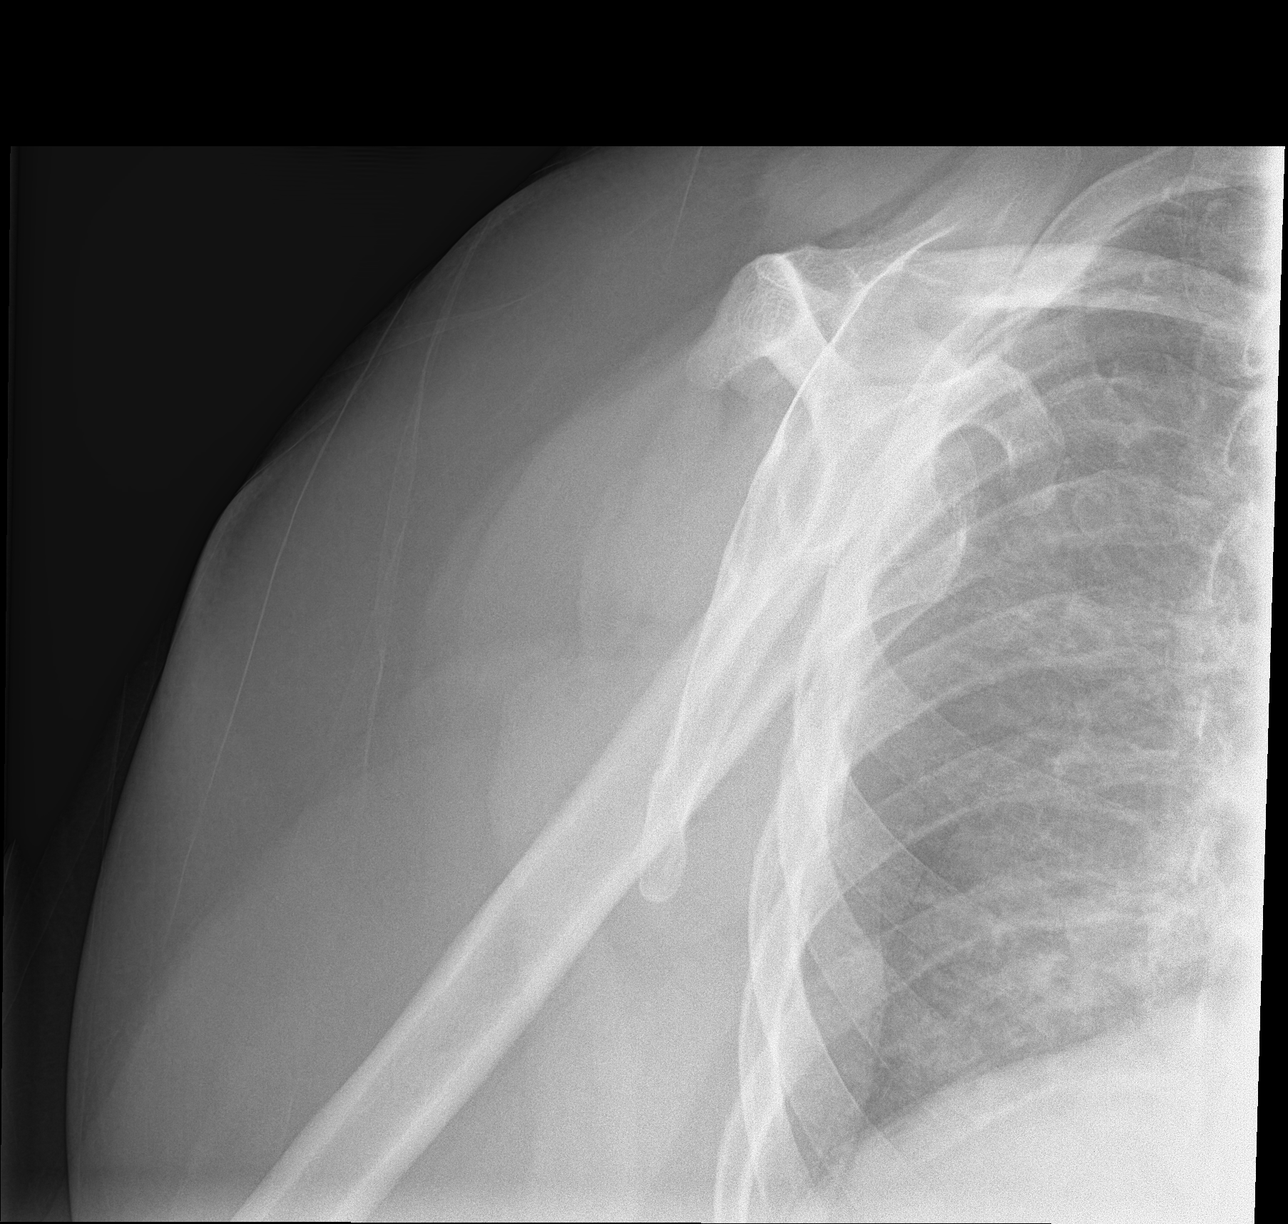

[shoulder axial]
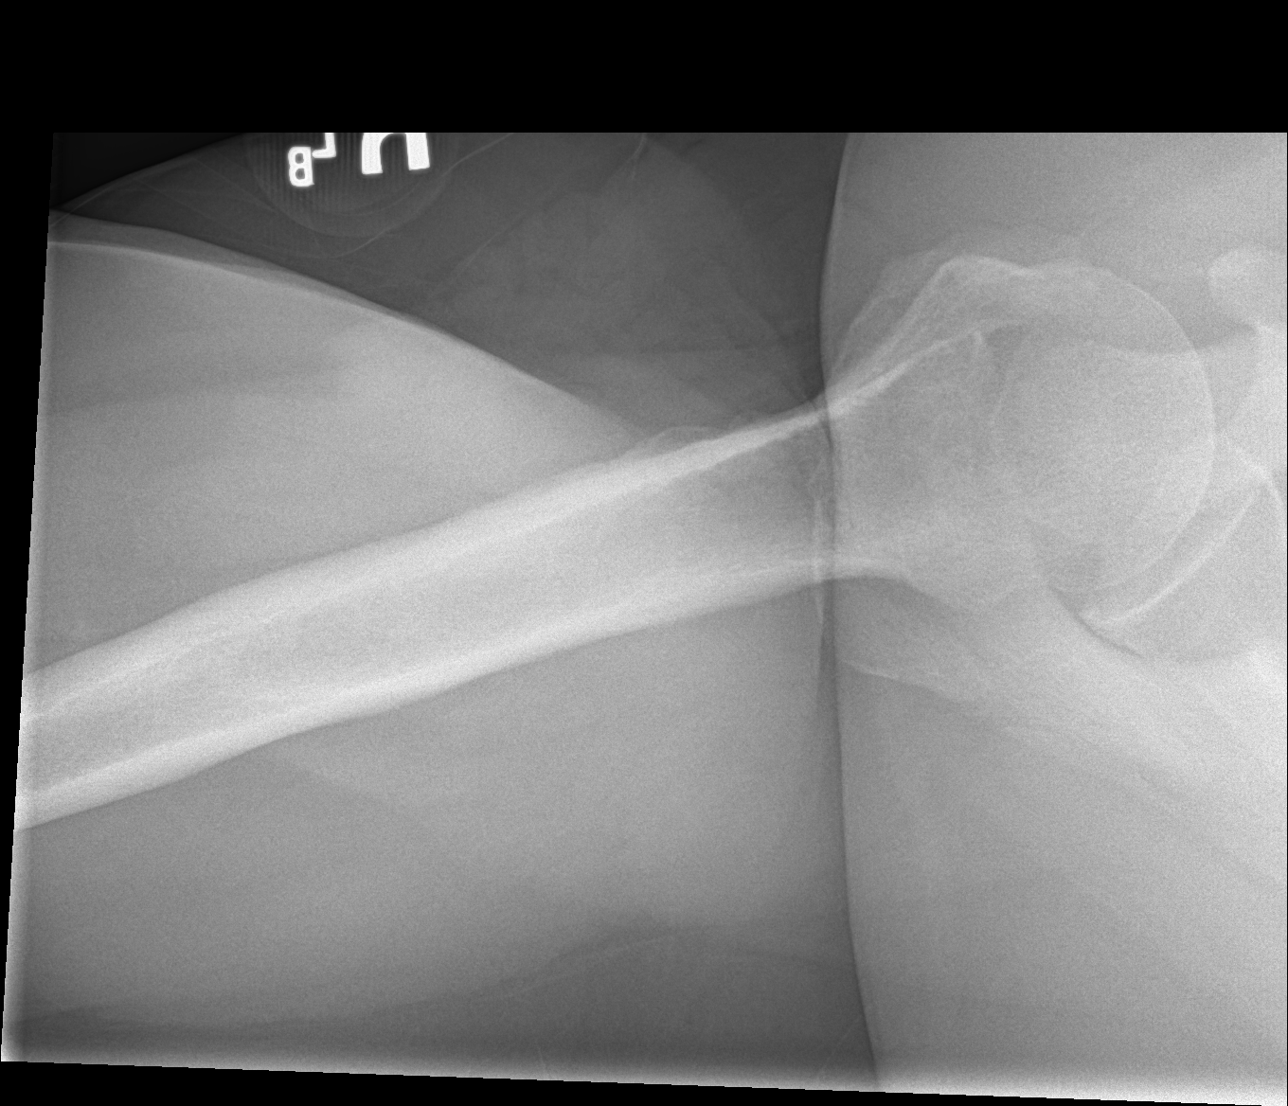

[4 of 4 positions shown; findings below may reference images not displayed]

FINDINGS: No acute fracture or dislocation.

No focal bony lesions are present.

Narrowing of the acromial humeral space likely represents chronic
rotator cuff tear.

No focal bony lesions are present.
IMPRESSION: No evidence of acute bony abnormality.

## 2017-06-10 IMAGING — CR DG KNEE COMPLETE 4+V*L*
2 series · 2 of 2 positions shown · non-contrast
Comparison: None.

CLINICAL DATA: Acute LEFT knee pain following fall. Initial
encounter.

EXAM:
LEFT KNEE - COMPLETE 4+ VIEW

[tunnel]
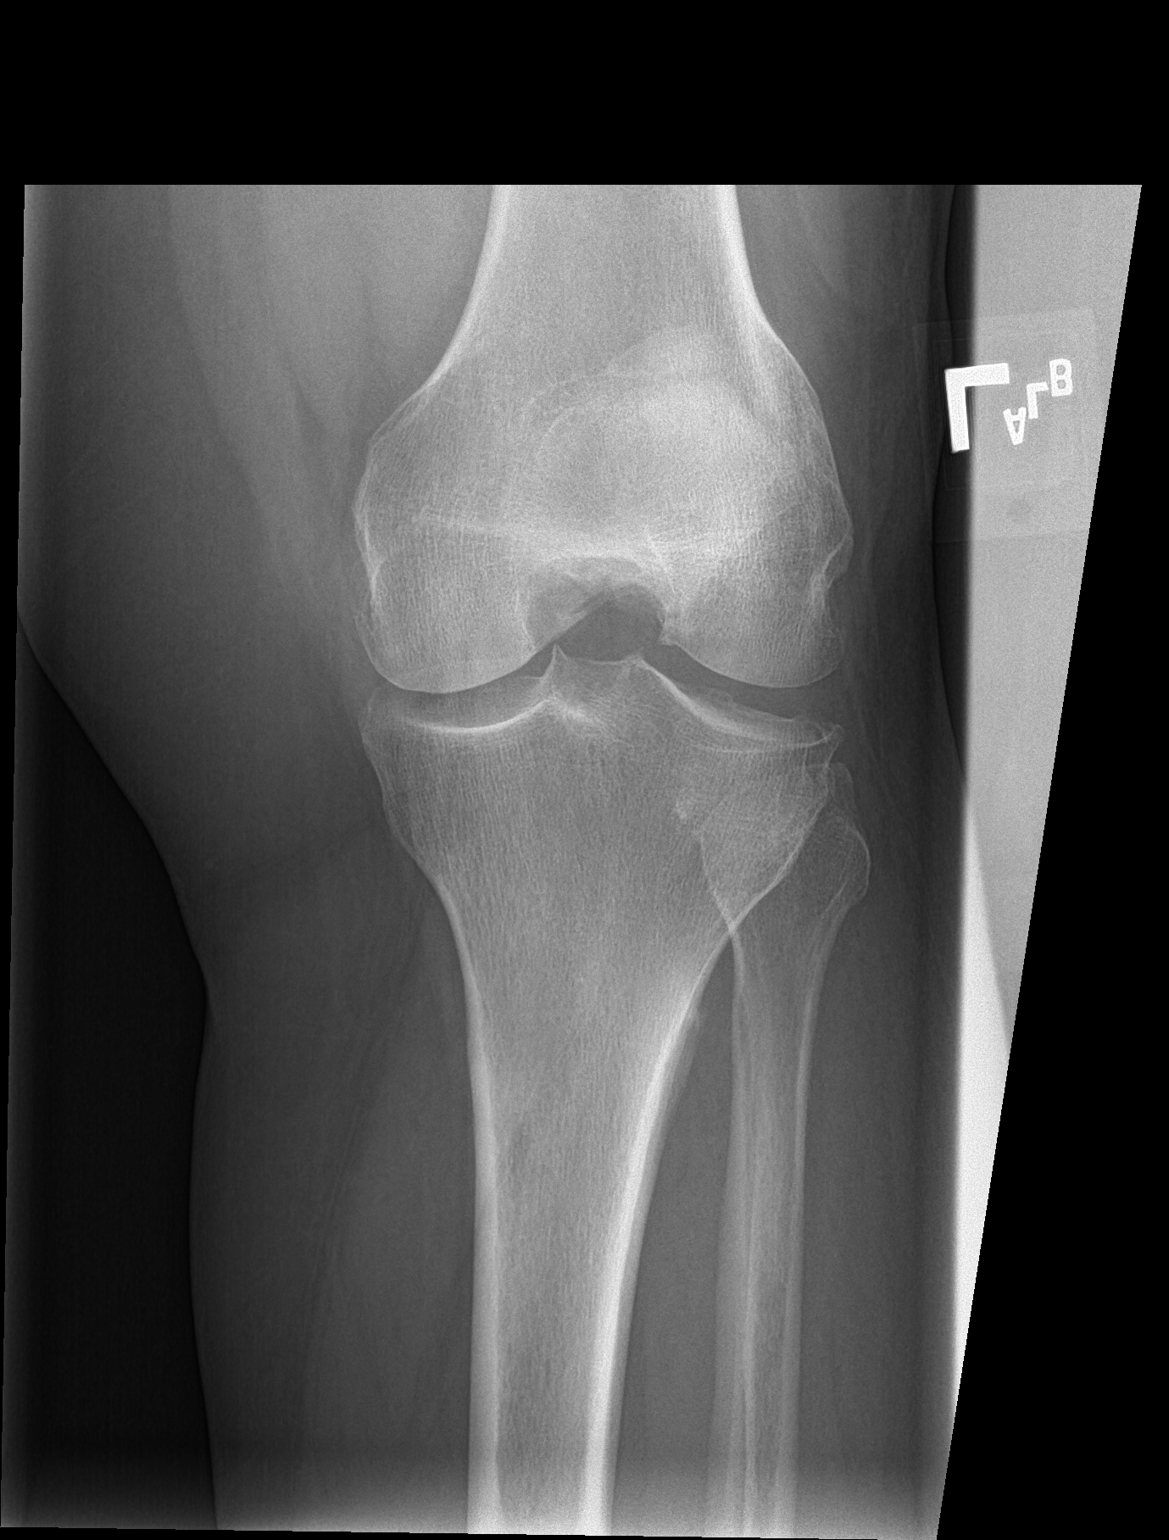

[patella skyline]
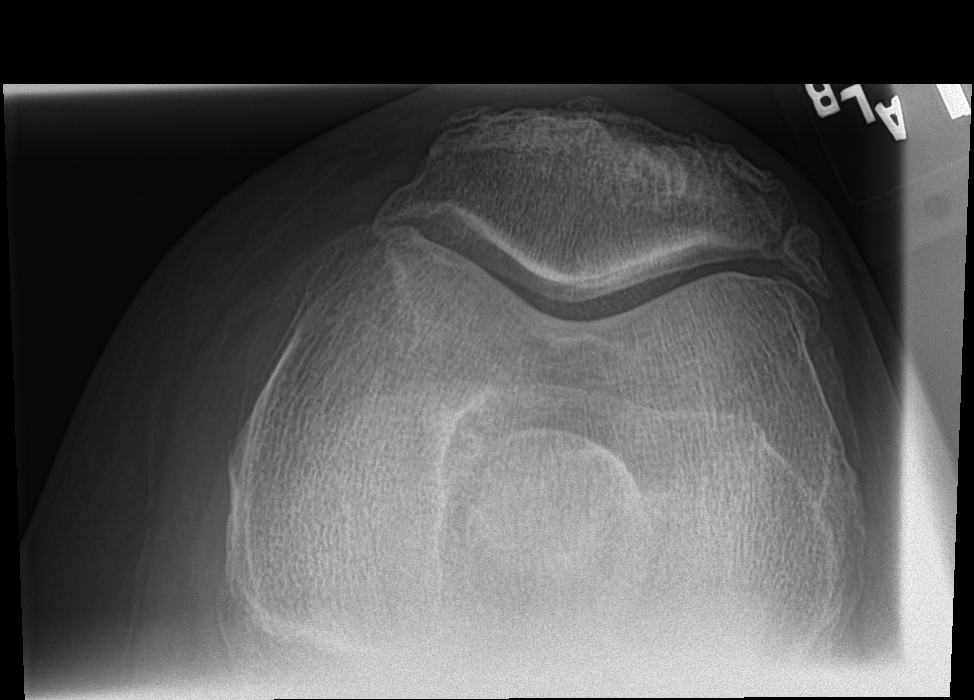

[2 of 2 positions shown; findings below may reference images not displayed]

FINDINGS: There is no evidence of acute fracture or subluxation.

No knee effusion is present.

Degenerative changes are present, mild in the MEDIAL and LATERAL
compartments and moderate in the patellofemoral compartment.

No focal bony lesions are identified.
IMPRESSION: 1. No evidence of acute abnormality
2. Degenerative changes as described.

## 2017-06-10 IMAGING — CR DG WRIST COMPLETE 3+V*R*
4 series · 4 of 4 positions shown · non-contrast
Comparison: None.

CLINICAL DATA: Acute RIGHT wrist pain following fall today. Initial
encounter.

EXAM:
RIGHT WRIST - COMPLETE 3+ VIEW

[wrist pa]
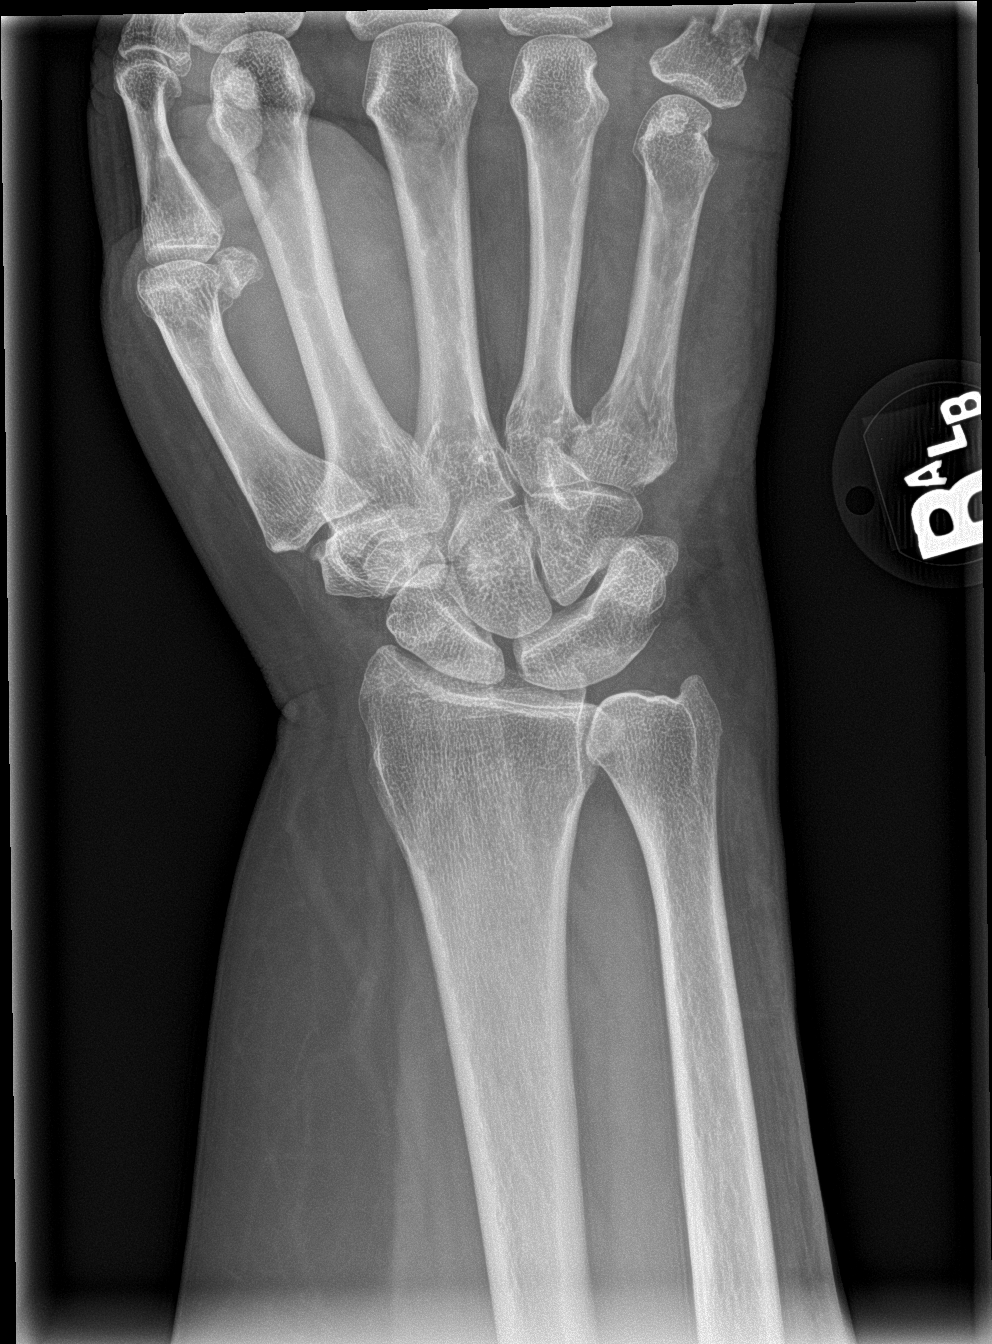

[wrist obl]
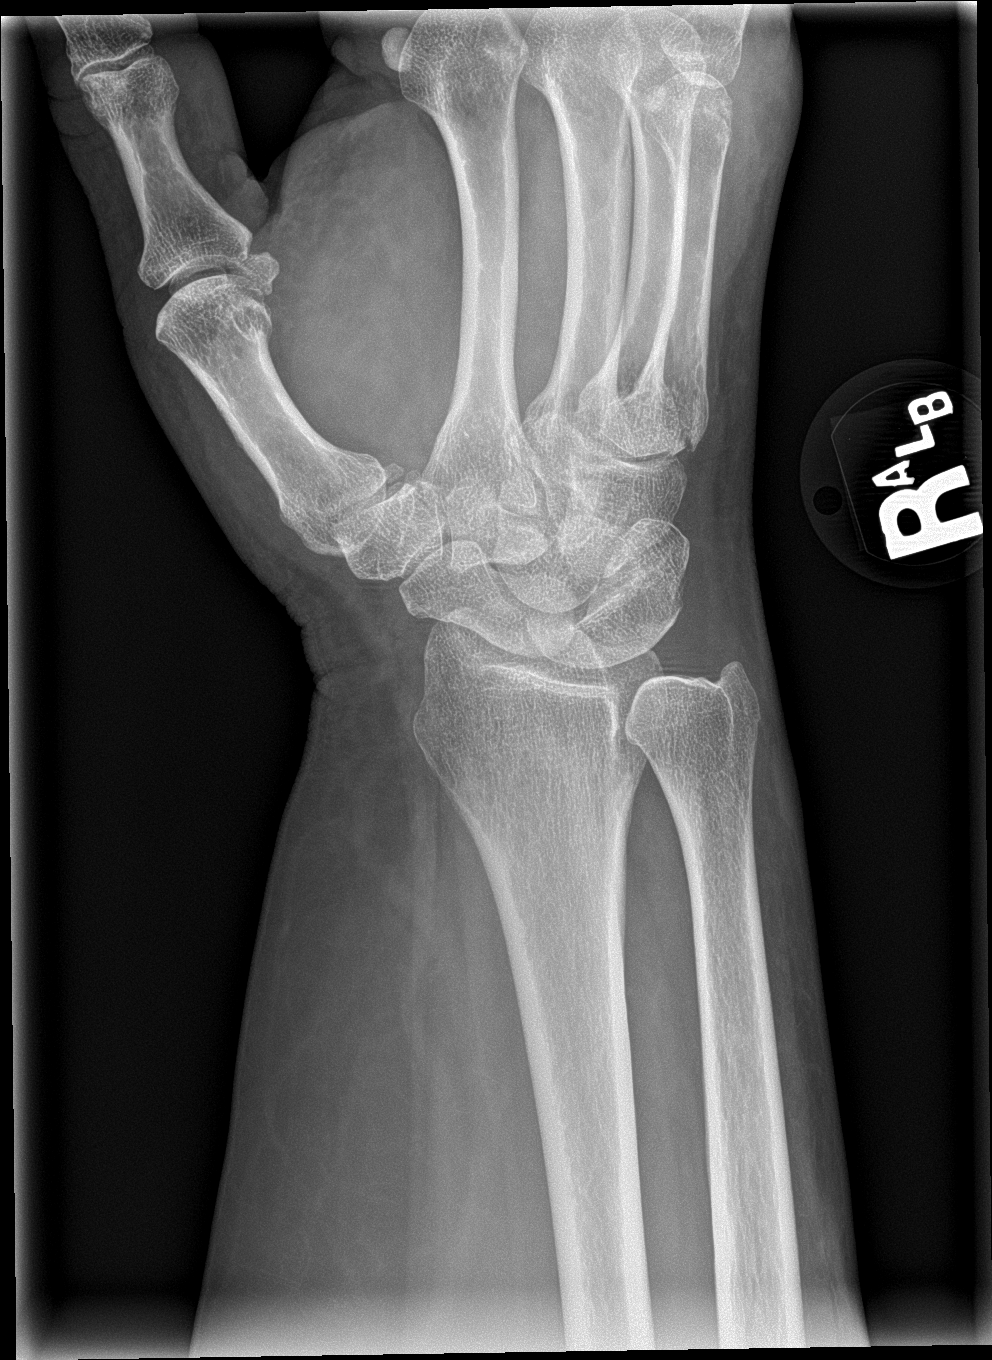

[wrist lat]
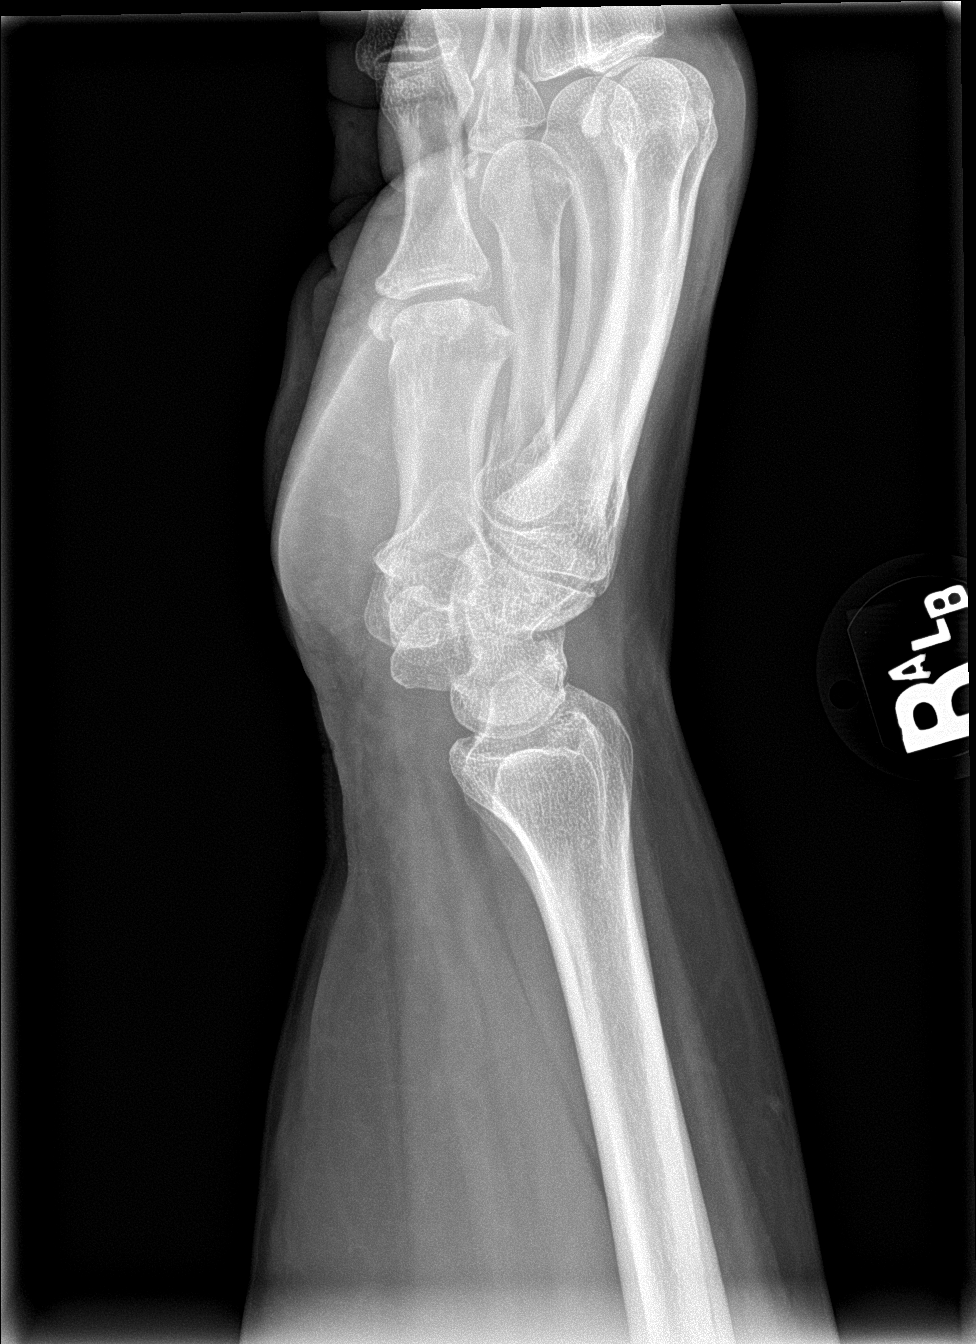

[wrist navicular]
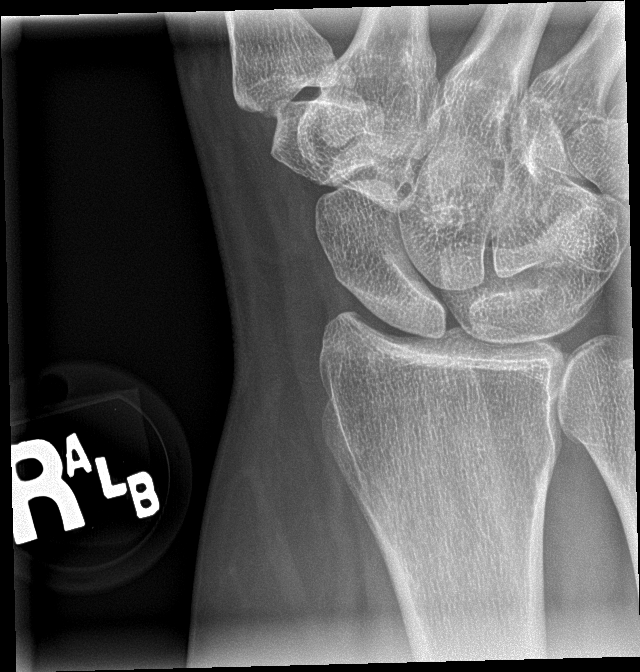

[4 of 4 positions shown; findings below may reference images not displayed]

FINDINGS: There is no evidence of acute wrist fracture, subluxation or
dislocation.

Fracture of the little finger proximal phalanx and probable fracture
of the fifth metacarpal head noted.

No other significant abnormalities identified.
IMPRESSION: 1. No evidence of acute wrist fracture.
2. Little finger proximal phalanx fracture and probable fracture of
the fifth metacarpal head.

## 2017-06-10 MED ORDER — OXYCODONE-ACETAMINOPHEN 5-325 MG PO TABS: ORAL_TABLET | ORAL | 0 refills | Status: DC

## 2017-06-10 MED ORDER — KETOROLAC TROMETHAMINE 60 MG/2ML IM SOLN
60.0000 mg | Freq: Once | INTRAMUSCULAR | Status: AC
  Administered 2017-06-10: 60 mg via INTRAMUSCULAR

## 2017-06-10 NOTE — Discharge Instructions (Addendum)
Recommend patient follow up with orthopedist today

## 2017-06-10 NOTE — ED Triage Notes (Signed)
Patient in today after tripping and falling this morning. Patient is c/o right hand pain, right shoulder and right elbow.

## 2017-06-10 NOTE — ED Provider Notes (Addendum)
[facility]    CSN: [id number] Arrival date & time: 06/10/17  1147     History   Chief Complaint Chief Complaint  Patient presents with  . Hand Pain    right    HPI [name] is a 62 y.o. female.   62 yo female with a c/o right hand, right wrist, right shoulder and left knee pain after falling about 1 hour ago. 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   The history is provided by the patient.    Past Medical History:  Diagnosis Date  . Depressive disorder   . GERD (gastroesophageal reflux disease)   . Hiatal hernia   . Macromastia   . Obesity   . Post-menopausal bleeding   . Sleep apnea     There are no active problems to display for this patient.   Past Surgical History:  Procedure Laterality Date  . BARIATRIC SURGERY    . buionectomy    . COLON SURGERY    . ENDOMETRIAL ABLATION    . gastectomy    . GASTRIC RESTRICTION SURGERY    . LAPAROSCOPIC PARTIAL PROTECTOMY    . NASAL SINUS SURGERY    . TUBAL LIGATION    . UGE      OB History   None      Home Medications    Prior to Admission medications   Medication Sig Start Date End Date Taking? Authorizing Provider  ALPRAZolam Duanne Moron) 0.5 MG tablet Take 0.5 mg by mouth at bedtime as needed for anxiety.    [provider]  amoxicillin (AMOXIL) 875 MG tablet Take 1 tablet (875 mg total) by mouth 2 (two) times daily. 06/28/15   Norval Gable, MD  citalopram (CELEXA) 20 MG tablet Take 20 mg by mouth daily.    [provider]  fluticasone (FLONASE) 50 MCG/ACT nasal spray Place into both nostrils daily.    [provider]  oxyCODONE-acetaminophen (PERCOCET/ROXICET) 5-325 MG tablet 1-2 tabs po bid prn 06/10/17   Norval Gable, MD    Family History Family History  Problem Relation Age of Onset  . Heart failure Mother   . Stroke Father   . Diabetes Father     Social History Social  History   Tobacco Use  . Smoking status: Former Smoker    Last attempt to quit: 1995    Years since quitting: 24.3  . Smokeless tobacco: Never Used  Substance Use Topics  . Alcohol use: Yes  . Drug use: No     Allergies   Hydrocodone; Latex; and Nickel   Review of Systems Review of Systems   Physical Exam Triage Vital Signs ED Triage Vitals  Enc Vitals Group     BP 06/10/17 1157 (!) 103/91     Pulse Rate 06/10/17 1157 80     Resp 06/10/17 1157 18     Temp 06/10/17 1157 98.6 F (37 C)     Temp Source 06/10/17 1157 Oral     SpO2 06/10/17 1157 98 %     Weight 06/10/17 1156 230 lb (104.3 kg)     Height 06/10/17 1156 5\' 4"  (1.626 m)     Head Circumference --      Peak Flow --      Pain Score 06/10/17 1156 10     Pain Loc --      Pain Edu? --  Excl. in GC? --    No data found.  Updated Vital Signs BP (!) 103/91 (BP Location: Left Arm)   Pulse 80   Temp 98.6 F (37 C) (Oral)   Resp 18   Ht 5\' 4"  (1.626 m)   Wt 230 lb (104.3 kg)   SpO2 98%   BMI 39.48 kg/m   Visual Acuity Right Eye Distance:   Left Eye Distance:   Bilateral Distance:    Right Eye Near:   Left Eye Near:    Bilateral Near:     Physical Exam  Constitutional: She appears well-developed and well-nourished. No distress.  Musculoskeletal:       Right shoulder: She exhibits tenderness, bony tenderness and pain. She exhibits normal range of motion, no swelling, no effusion, no crepitus, no deformity, no laceration, no spasm, normal pulse and normal strength.       Right wrist: She exhibits decreased range of motion, tenderness, bony tenderness and swelling. She exhibits no effusion, no crepitus, no deformity and no laceration.       Left knee: She exhibits swelling. She exhibits normal range of motion, no effusion, no ecchymosis, no deformity, no laceration, no erythema, normal alignment, no LCL laxity, normal patellar mobility, no bony tenderness, normal meniscus and no MCL laxity. Tenderness  found.       Right hand: She exhibits decreased range of motion, tenderness, bony tenderness and swelling. She exhibits normal two-point discrimination, normal capillary refill, no deformity and no laceration. Normal sensation noted. Decreased strength noted.  Skin: She is not diaphoretic.  Nursing note and vitals reviewed.    UC Treatments / Results  Labs (all labs ordered are listed, but only abnormal results are displayed) Labs Reviewed - No data to display  EKG None  Radiology Dg Shoulder Right  Result Date: 06/10/2017 CLINICAL DATA:  Acute RIGHT shoulder pain following fall. Initial encounter. EXAM: RIGHT SHOULDER - 2+ VIEW COMPARISON:  None. FINDINGS: No acute fracture or dislocation. No focal bony lesions are present. Narrowing of the acromial humeral space likely represents chronic rotator cuff tear. No focal bony lesions are present. IMPRESSION: No evidence of acute bony abnormality. Electronically Signed   By: Margarette Canada M.D.   On: 06/10/2017 13:19   Dg Wrist Complete Right  Result Date: 06/10/2017 CLINICAL DATA:  Acute RIGHT wrist pain following fall today. Initial encounter. EXAM: RIGHT WRIST - COMPLETE 3+ VIEW COMPARISON:  None. FINDINGS: There is no evidence of acute wrist fracture, subluxation or dislocation. Fracture of the little finger proximal phalanx and probable fracture of the fifth metacarpal head noted. No other significant abnormalities identified. IMPRESSION: 1. No evidence of acute wrist fracture. 2. Little finger proximal phalanx fracture and probable fracture of the fifth metacarpal head. Electronically Signed   By: Margarette Canada M.D.   On: 06/10/2017 13:18   Dg Knee Complete 4 Views Left  Result Date: 06/10/2017 CLINICAL DATA:  Acute LEFT knee pain following fall. Initial encounter. EXAM: LEFT KNEE - COMPLETE 4+ VIEW COMPARISON:  None. FINDINGS: There is no evidence of acute fracture or subluxation. No knee effusion is present. Degenerative changes are present,  mild in the MEDIAL and LATERAL compartments and moderate in the patellofemoral compartment. No focal bony lesions are identified. IMPRESSION: 1. No evidence of acute abnormality 2. Degenerative changes as described. Electronically Signed   By: Margarette Canada M.D.   On: 06/10/2017 13:21   Dg Hand Complete Right  Result Date: 06/10/2017 CLINICAL DATA:  Acute RIGHT hand pain  following fall today. Initial encounter. EXAM: RIGHT HAND - COMPLETE 3+ VIEW COMPARISON:  None. FINDINGS: A transverse fracture of the proximal aspect of the little finger proximal phalanx noted with equivocal extension to the MCP joint. 2 mm ulnar displacement noted. Dorsal soft tissue swelling noted. An equivocal fracture of the fifth metacarpal head noted. No subluxation or dislocation noted. Dorsal soft tissue swelling is present. IMPRESSION: Fracture of the little finger proximal phalanx as described and probable fracture of the fifth metacarpal head. Electronically Signed   By: Margarette Canada M.D.   On: 06/10/2017 13:17    Procedures Procedures (including critical care time)  Medications Ordered in UC Medications  ketorolac (TORADOL) injection 60 mg (60 mg Intramuscular Given 06/10/17 1207)    Initial Impression / Assessment and Plan / UC Course  I have reviewed the triage vital signs and the nursing notes.  Pertinent labs & imaging results that were available during my care of the patient were reviewed by me and considered in my medical decision making (see chart for details).      Final Clinical Impressions(s) / UC Diagnoses   Final diagnoses:  Closed displaced fracture of proximal phalanx of right little finger, initial encounter  Closed nondisplaced fracture of neck of fifth metacarpal bone of right hand, initial encounter  Closed nondisplaced fracture of base of fifth metacarpal bone of right hand, initial encounter     Discharge Instructions     Recommend patient follow up with orthopedist today    ED  Prescriptions    Medication Sig Dispense Auth. Provider   oxyCODONE-acetaminophen (PERCOCET/ROXICET) 5-325 MG tablet 1-2 tabs po bid prn 8 tablet Neriah Brott, Linward Foster, MD      1. x-ray results and diagnosis reviewed with patient   2.pateint given toradol 60mg  IM x 1 3.  rx as per orders above; reviewed possible side effects, interactions, risks and benefits  4. Immobilized with ulnar gutter splint and splint 5. Recommend supportive treatment with elevation, otc analgesics 6. Follow-up with orthopedist for further evaluation and management  Controlled Substance Prescriptions [place] Controlled Substance Registry consulted? Not Applicable   Norval Gable, MD 06/10/17 Kristian Covey    Norval Gable, MD 06/10/17 Bosie Helper

## 2018-11-17 ENCOUNTER — Other Ambulatory Visit: Payer: Self-pay

## 2018-11-17 DIAGNOSIS — Z20822 Contact with and (suspected) exposure to covid-19: Secondary | ICD-10-CM

## 2018-11-19 LAB — NOVEL CORONAVIRUS, NAA: SARS-CoV-2, NAA: NOT DETECTED

## 2021-01-07 ENCOUNTER — Emergency Department: Payer: BLUE CROSS/BLUE SHIELD

## 2021-01-07 ENCOUNTER — Inpatient Hospital Stay: Payer: BLUE CROSS/BLUE SHIELD

## 2021-01-07 ENCOUNTER — Inpatient Hospital Stay
Admission: EM | Admit: 2021-01-07 | Discharge: 2021-01-10 | DRG: 054 | Disposition: A | Discharge status: Home / Self Care | Payer: BLUE CROSS/BLUE SHIELD | Attending: Internal Medicine | Admitting: Internal Medicine

## 2021-01-07 ENCOUNTER — Encounter: Payer: Self-pay | Admitting: Radiology

## 2021-01-07 DIAGNOSIS — F32A Depression, unspecified: Secondary | ICD-10-CM | POA: Diagnosis present

## 2021-01-07 DIAGNOSIS — Z87891 Personal history of nicotine dependence: Secondary | ICD-10-CM

## 2021-01-07 DIAGNOSIS — E669 Obesity, unspecified: Secondary | ICD-10-CM | POA: Diagnosis present

## 2021-01-07 DIAGNOSIS — D320 Benign neoplasm of cerebral meninges: Principal | ICD-10-CM | POA: Diagnosis present

## 2021-01-07 DIAGNOSIS — U071 COVID-19: Secondary | ICD-10-CM | POA: Diagnosis present

## 2021-01-07 DIAGNOSIS — G4730 Sleep apnea, unspecified: Secondary | ICD-10-CM | POA: Diagnosis present

## 2021-01-07 DIAGNOSIS — E079 Disorder of thyroid, unspecified: Secondary | ICD-10-CM | POA: Diagnosis present

## 2021-01-07 DIAGNOSIS — Z9884 Bariatric surgery status: Secondary | ICD-10-CM | POA: Diagnosis not present

## 2021-01-07 DIAGNOSIS — Z9851 Tubal ligation status: Secondary | ICD-10-CM | POA: Diagnosis not present

## 2021-01-07 DIAGNOSIS — Z8249 Family history of ischemic heart disease and other diseases of the circulatory system: Secondary | ICD-10-CM

## 2021-01-07 DIAGNOSIS — F419 Anxiety disorder, unspecified: Secondary | ICD-10-CM | POA: Diagnosis present

## 2021-01-07 DIAGNOSIS — G936 Cerebral edema: Secondary | ICD-10-CM | POA: Diagnosis present

## 2021-01-07 DIAGNOSIS — Z91040 Latex allergy status: Secondary | ICD-10-CM | POA: Diagnosis not present

## 2021-01-07 DIAGNOSIS — Z79899 Other long term (current) drug therapy: Secondary | ICD-10-CM | POA: Diagnosis not present

## 2021-01-07 DIAGNOSIS — K219 Gastro-esophageal reflux disease without esophagitis: Secondary | ICD-10-CM | POA: Diagnosis present

## 2021-01-07 DIAGNOSIS — G4733 Obstructive sleep apnea (adult) (pediatric): Secondary | ICD-10-CM | POA: Diagnosis present

## 2021-01-07 DIAGNOSIS — Z91048 Other nonmedicinal substance allergy status: Secondary | ICD-10-CM | POA: Diagnosis not present

## 2021-01-07 DIAGNOSIS — R079 Chest pain, unspecified: Secondary | ICD-10-CM

## 2021-01-07 DIAGNOSIS — R221 Localized swelling, mass and lump, neck: Secondary | ICD-10-CM | POA: Diagnosis present

## 2021-01-07 DIAGNOSIS — Z885 Allergy status to narcotic agent status: Secondary | ICD-10-CM | POA: Diagnosis not present

## 2021-01-07 DIAGNOSIS — M19011 Primary osteoarthritis, right shoulder: Secondary | ICD-10-CM | POA: Diagnosis present

## 2021-01-07 DIAGNOSIS — Z6841 Body Mass Index (BMI) 40.0 and over, adult: Secondary | ICD-10-CM | POA: Diagnosis not present

## 2021-01-07 DIAGNOSIS — R0789 Other chest pain: Secondary | ICD-10-CM | POA: Diagnosis present

## 2021-01-07 DIAGNOSIS — E66813 Obesity, class 3: Secondary | ICD-10-CM | POA: Diagnosis present

## 2021-01-07 DIAGNOSIS — G8929 Other chronic pain: Secondary | ICD-10-CM | POA: Diagnosis present

## 2021-01-07 LAB — BASIC METABOLIC PANEL
Anion gap: 6 (ref 5–15)
BUN: 21 mg/dL (ref 8–23)
CO2: 24 mmol/L (ref 22–32)
Calcium: 8.6 mg/dL — ABNORMAL LOW (ref 8.9–10.3)
Chloride: 107 mmol/L (ref 98–111)
Creatinine, Ser: 0.83 mg/dL (ref 0.44–1.00)
GFR, Estimated: >60 mL/min (ref >60)
Glucose, Bld: 87 mg/dL (ref 70–99)
Potassium: 4.5 mmol/L (ref 3.5–5.1)
Sodium: 137 mmol/L (ref 135–145)

## 2021-01-07 LAB — CBC WITH DIFFERENTIAL/PLATELET
Abs Immature Granulocytes: 0.01 10*3/uL (ref 0.00–0.07)
Basophils Absolute: 0.1 10*3/uL (ref 0.0–0.1)
Basophils Relative: 1 %
Eosinophils Absolute: 0.2 10*3/uL (ref 0.0–0.5)
Eosinophils Relative: 3 %
HCT: 41.7 % (ref 36.0–46.0)
Hemoglobin: 12.9 g/dL (ref 12.0–15.0)
Immature Granulocytes: 0 %
Lymphocytes Relative: 53 %
Lymphs Abs: 4 10*3/uL (ref 0.7–4.0)
MCH: 30.6 pg (ref 26.0–34.0)
MCHC: 30.9 g/dL (ref 30.0–36.0)
MCV: 99 fL (ref 80.0–100.0)
Monocytes Absolute: 0.5 10*3/uL (ref 0.1–1.0)
Monocytes Relative: 7 %
Neutro Abs: 2.8 10*3/uL (ref 1.7–7.7)
Neutrophils Relative %: 36 %
Platelets: 261 10*3/uL (ref 150–400)
RBC: 4.21 MIL/uL (ref 3.87–5.11)
RDW: 12 % (ref 11.5–15.5)
WBC: 7.6 10*3/uL (ref 4.0–10.5)
nRBC: 0 % (ref 0.0–0.2)

## 2021-01-07 LAB — HIV ANTIBODY (ROUTINE TESTING W REFLEX): HIV Screen 4th Generation wRfx: NONREACTIVE

## 2021-01-07 LAB — TROPONIN I (HIGH SENSITIVITY): Troponin I (High Sensitivity): 3 ng/L (ref <18)

## 2021-01-07 IMAGING — CR DG SHOULDER 2+V*R*
3 series · 3 of 3 positions shown · non-contrast
Comparison: Right shoulder radiographs [DATE].

CLINICAL DATA: 65-year-old female with history of right-sided
shoulder pain.

EXAM:
RIGHT SHOULDER - 2+ VIEW

[shoulder grashey]
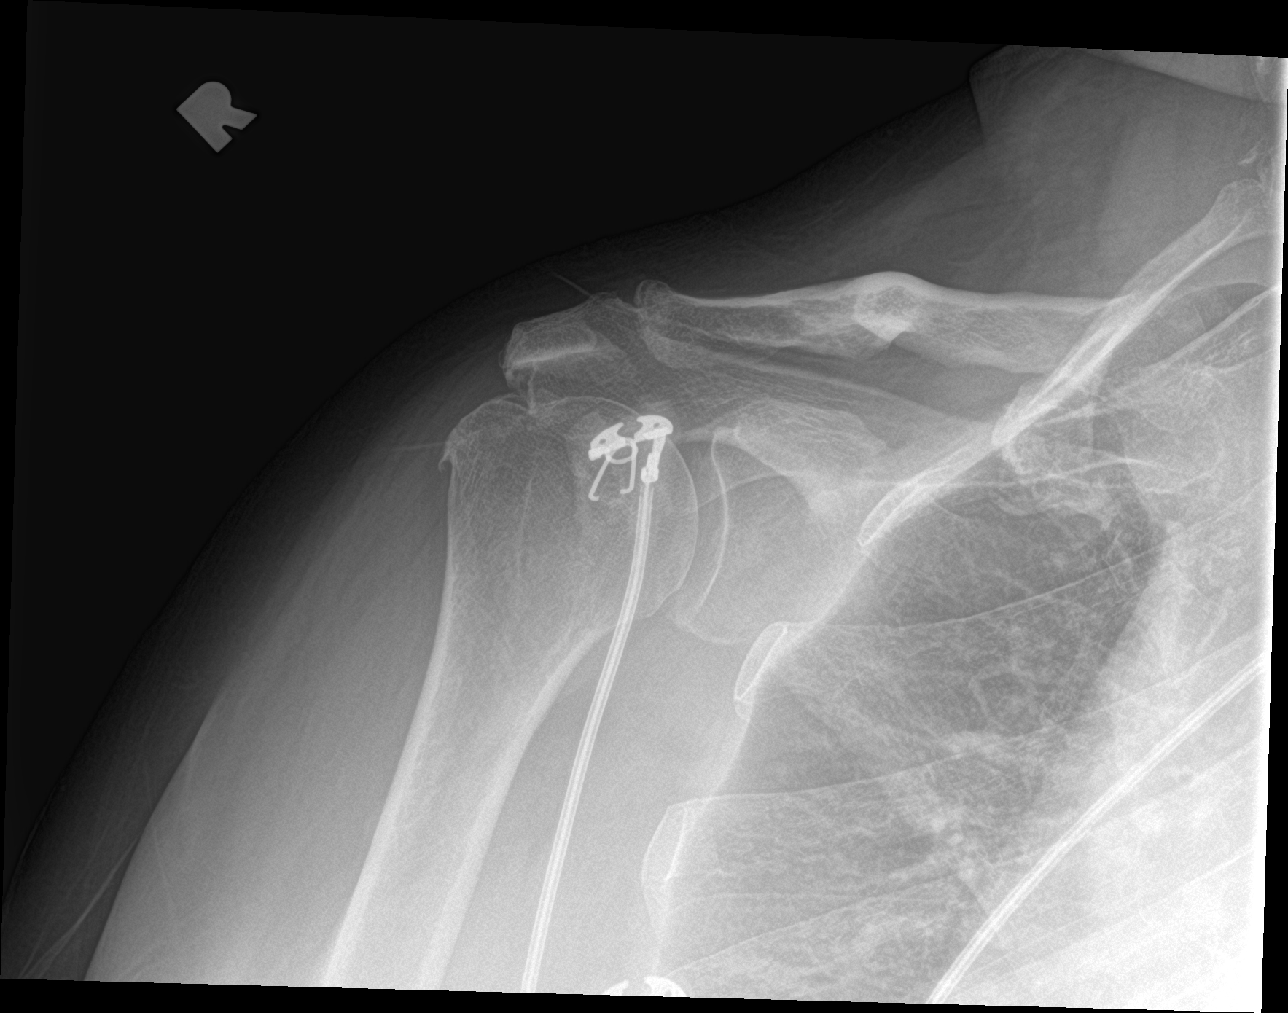

[shoulder y view]
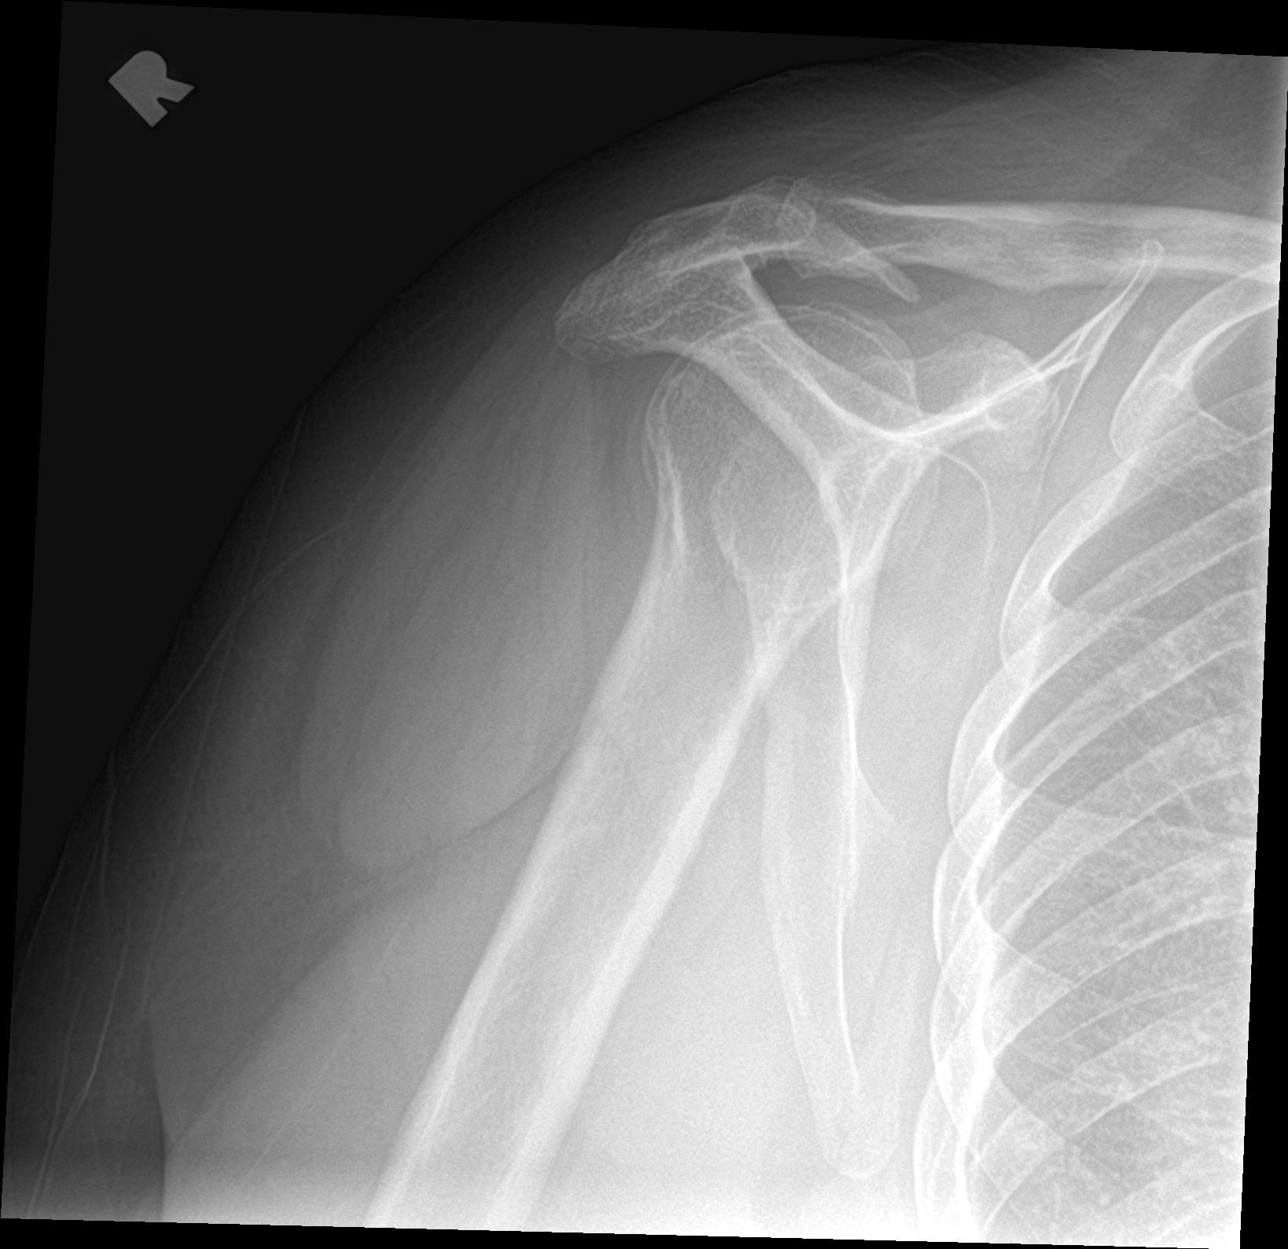

[shoulder ap neutral]
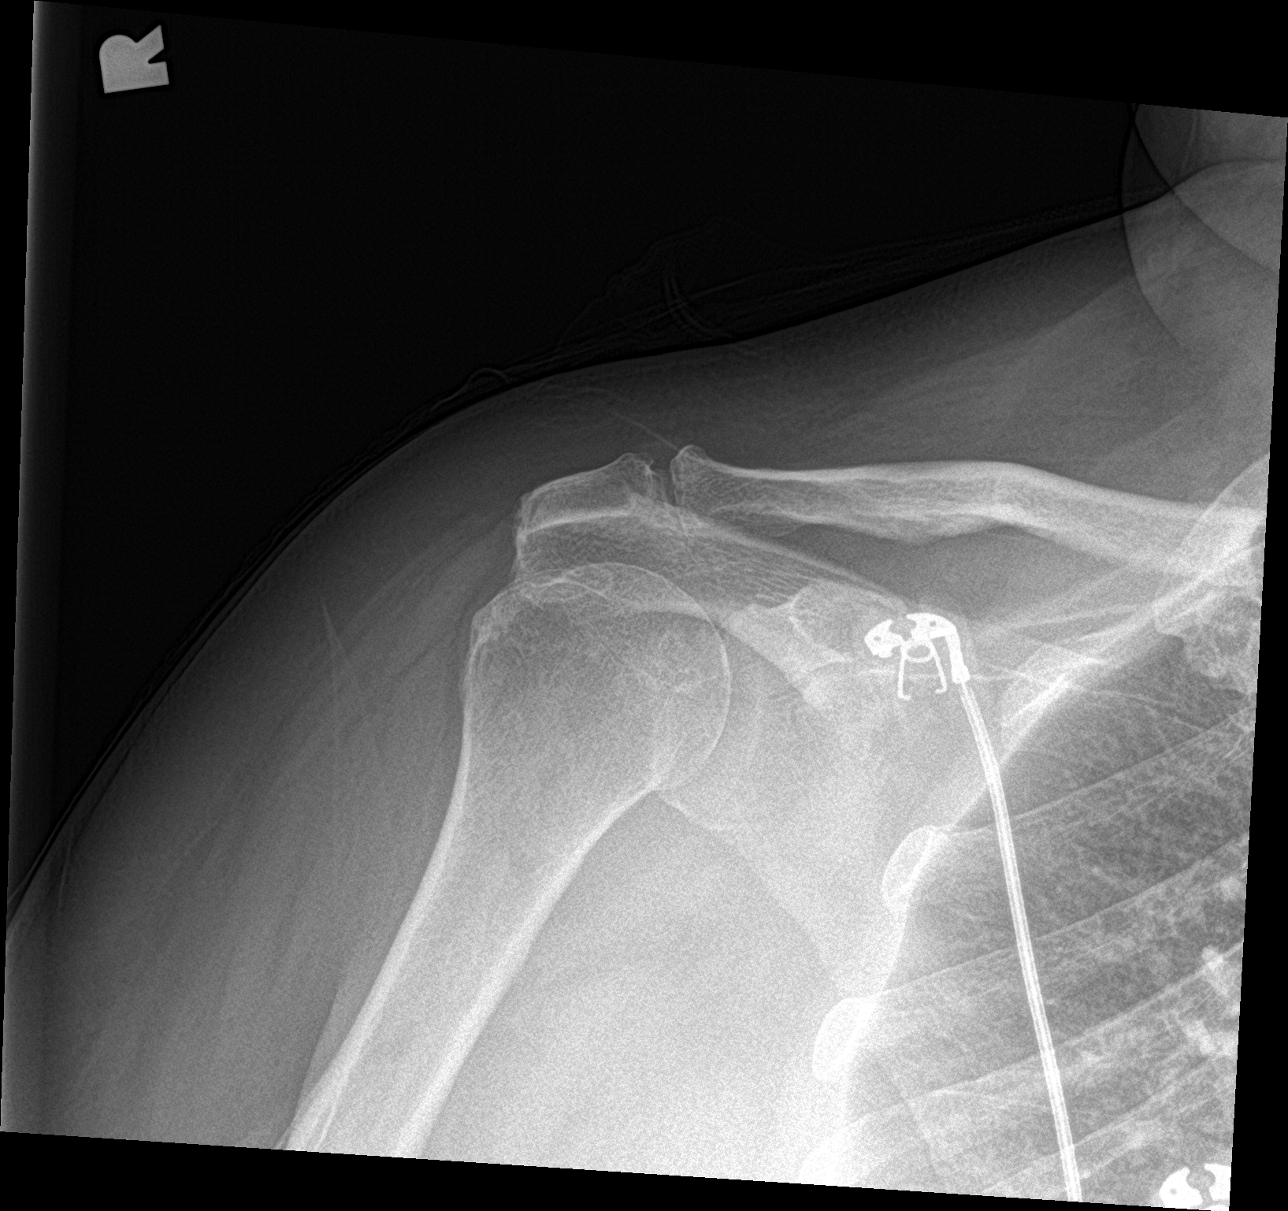

[3 of 3 positions shown; findings below may reference images not displayed]

FINDINGS: Three views of the right shoulder demonstrate no acute displaced
fracture or dislocation. Humeral head is slightly high riding,
suggesting underlying rotator cuff pathology. There is joint space
narrowing, subchondral sclerosis and osteophyte formation in the
glenohumeral and acromioclavicular joints, compatible with
osteoarthritis.
IMPRESSION: 1. No acute radiographic abnormality of the right shoulder.
2. Chronic degenerative changes of osteoarthritis in the
glenohumeral and acromioclavicular joints, and evidence of probable
chronic rotator cuff disease, as above.

## 2021-01-07 IMAGING — CT CT ANGIO CHEST
2 of 7 series · 18 of 46 positions shown · IV contrast (omnipaque)
Comparison: No priors.  Chest x-ray [DATE].

CLINICAL DATA: 65-year-old female with history of chest pain.
Possible mass noted on chest x-ray. Follow-up study.

EXAM:
CT ANGIOGRAPHY CHEST WITH CONTRAST
TECHNIQUE: Multidetector CT imaging of the chest was performed using the
standard protocol during bolus administration of intravenous
contrast. Multiplanar CT image reconstructions and MIPs were
obtained to evaluate the vascular anatomy.
CONTRAST:  80mL OMNIPAQUE IOHEXOL 350 MG/ML SOLN

[Series 5: thins · axial · 0.60mm/px · z∈[-558,-338]mm · 15 of 307 slices shown]
[im 16/307  lung]
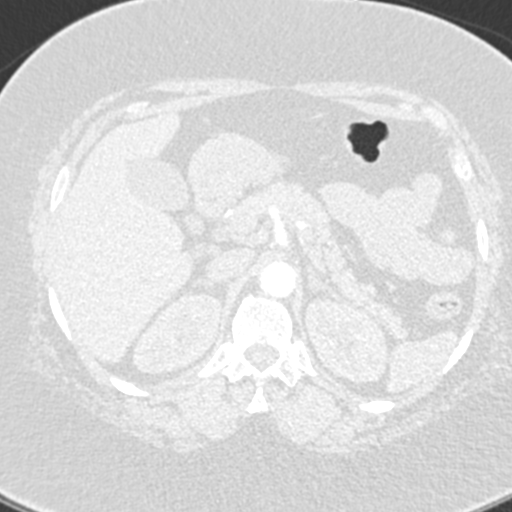
[im 31/307  soft-tissue]
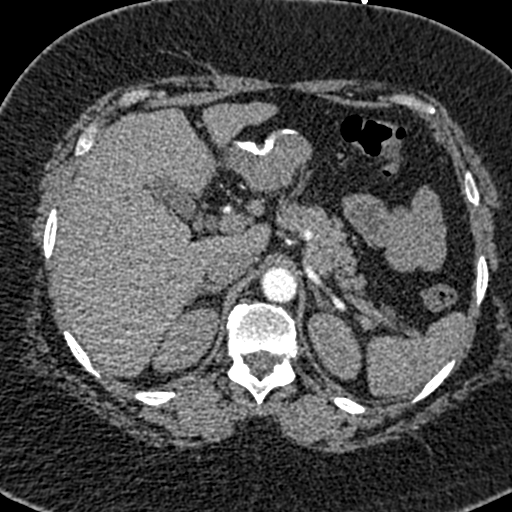
[im 62/307  lung]
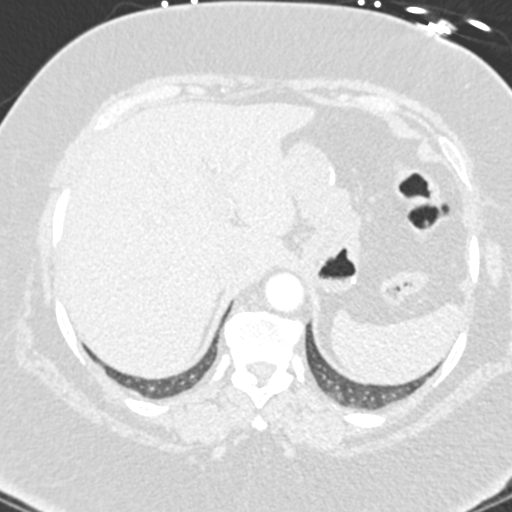
[im 77/307  soft-tissue]
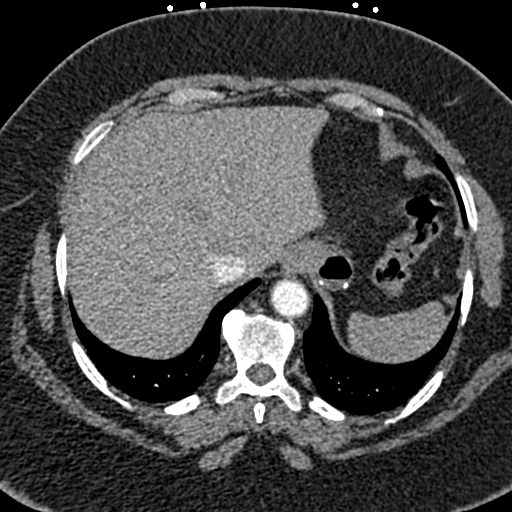
[im 92/307  lung]
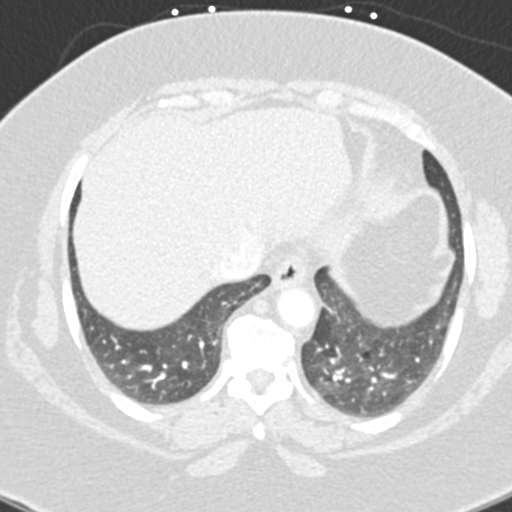
[im 108/307  soft-tissue]
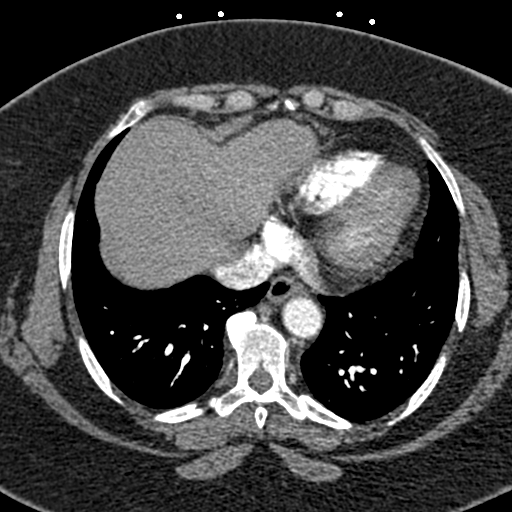
[im 138/307  lung]
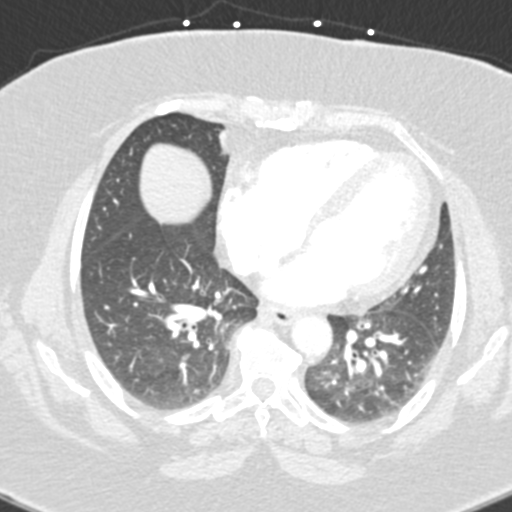
[im 154/307  soft-tissue]
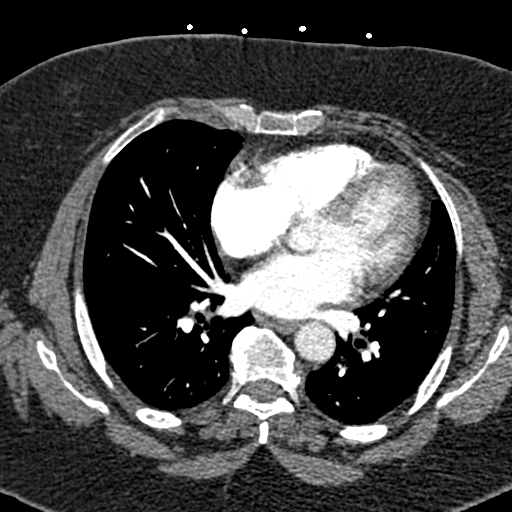
[im 169/307  lung]
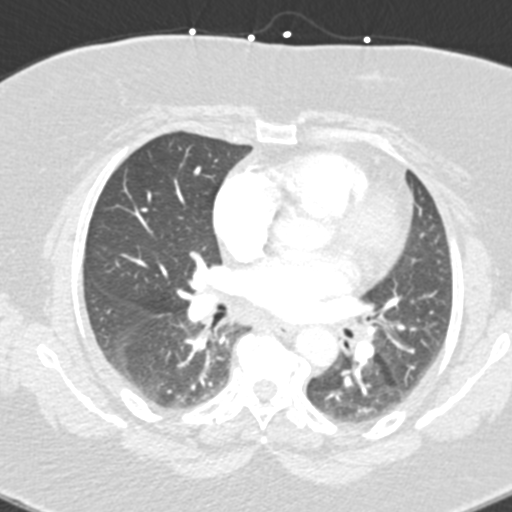
[im 199/307  soft-tissue]
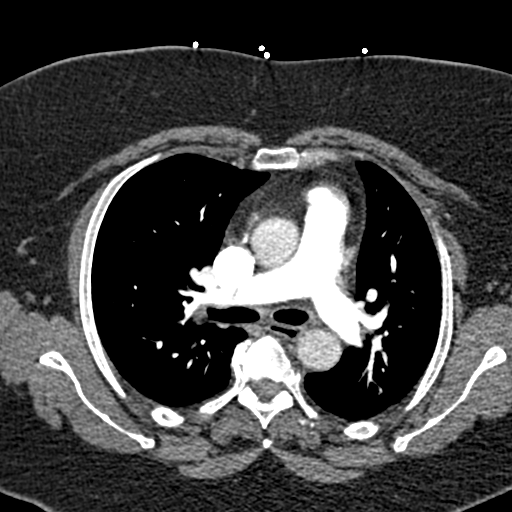
[im 215/307  lung]
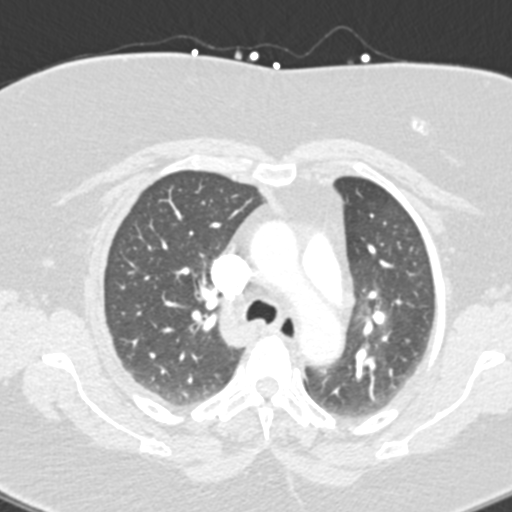
[im 230/307  soft-tissue]
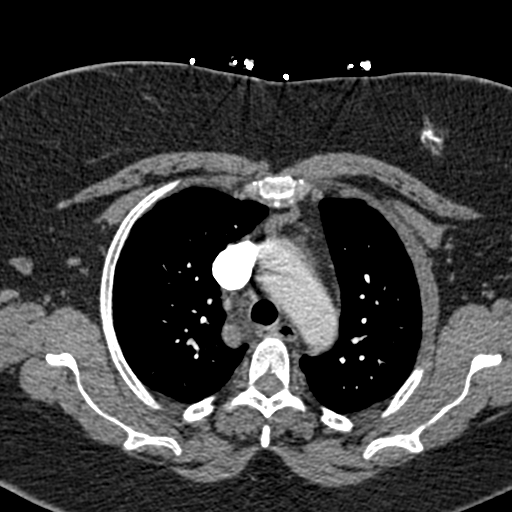
[im 245/307  lung]
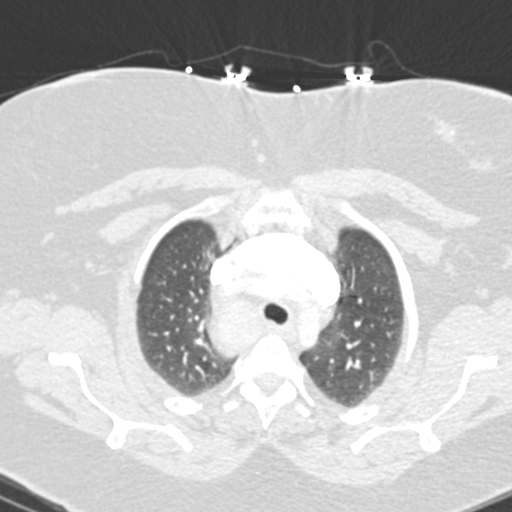
[im 276/307  soft-tissue]
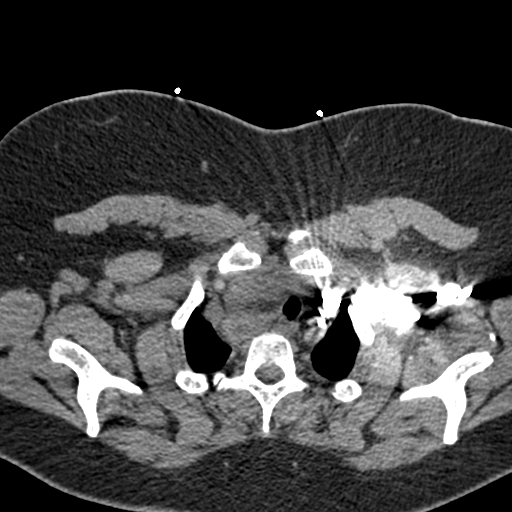
[im 291/307  lung]
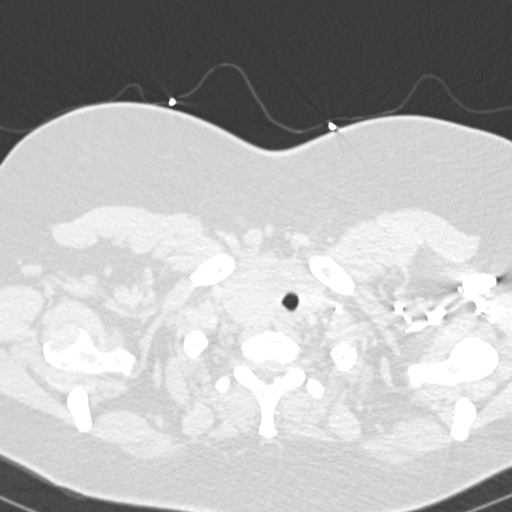

[Series 7: coronal mpr · coronal · 0.49mm/px · 3 of 92 slices shown]
[im 23/92  soft-tissue]
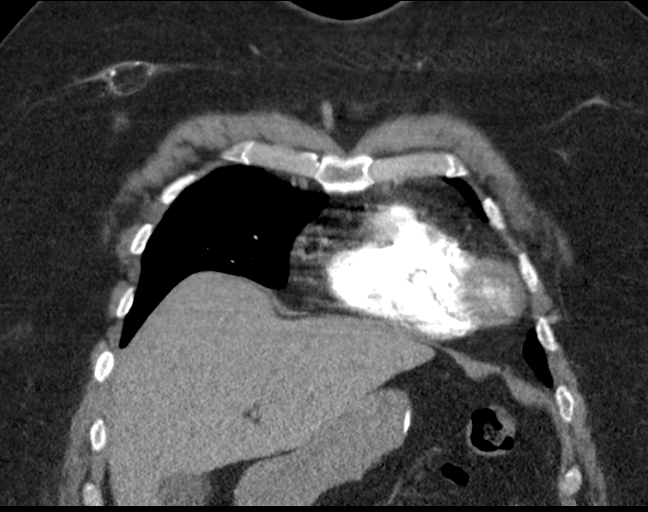
[im 46/92  soft-tissue]
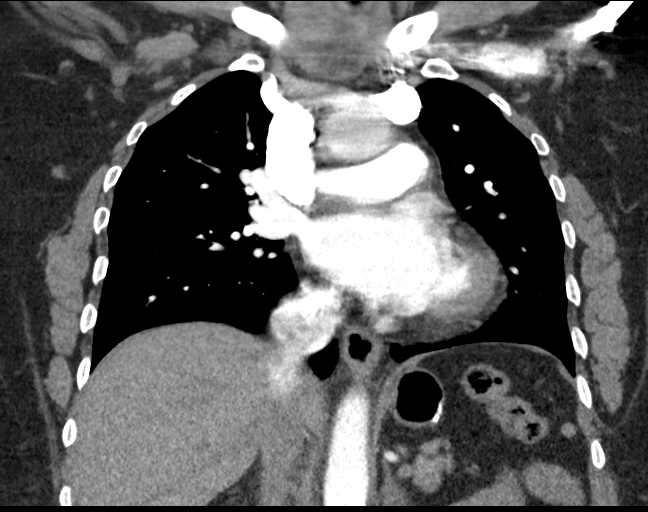
[im 69/92  soft-tissue]
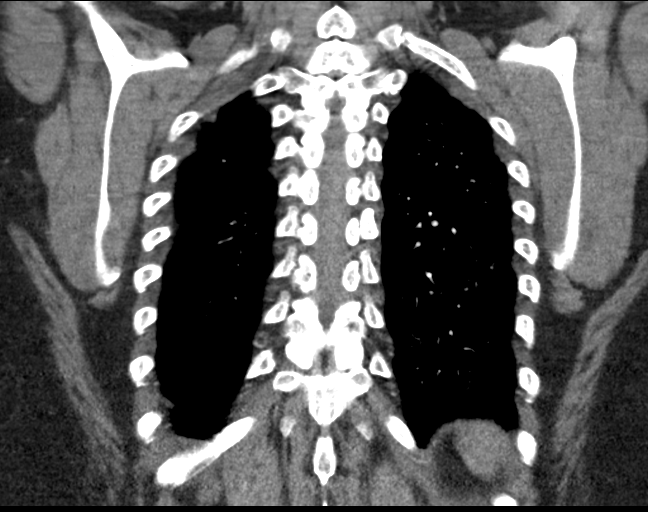

[18 of 46 positions shown; findings below may reference images not displayed]

FINDINGS: Cardiovascular: No filling defects are noted within the pulmonary
arterial tree to suggest pulmonary embolism. Heart size is normal.
There is no significant pericardial fluid, thickening or pericardial
calcification. No atherosclerotic calcifications are noted in the
thoracic aorta or the coronary arteries.

Mediastinum/Nodes: The previously described high right paratracheal
mass corresponds to asymmetric mass-like enlargement of the right
lobe of the thyroid gland which is incompletely imaged on today's
study, but estimated to measure at least 5.7 x 3.3 x 6.9 cm.
Enlarged subcarinal lymph node also noted measuring 1.7 cm in short
axis. Esophagus is unremarkable in appearance. No axillary
lymphadenopathy.

Lungs/Pleura: Status post sleeve gastrectomy.

Upper Abdomen: There are no aggressive appearing lytic or blastic
lesions noted in the visualized portions of the skeleton.

Musculoskeletal: There are no aggressive appearing lytic or blastic
lesions noted in the visualized portions of the skeleton.

Review of the MIP images confirms the above findings.
IMPRESSION: 1. No evidence of pulmonary embolism.
2. Asymmetric mass-like enlargement of the right lobe of the thyroid
gland which has clearly progressed compared to prior examinations
based on old chest radiographs. This finding is concerning for
potential primary thyroid neoplasm. Further clinical evaluation is
recommended.
3. Mildly enlarged subcarinal lymph node. The possibility of
metastatic disease should be considered.

## 2021-01-07 IMAGING — MR MR HEAD WO/W CM
15 series · 48 of 48 positions shown · IV contrast (gadavist)
Comparison: No pertinent prior exams available for comparison.

CLINICAL DATA: Brain metastases suspected. Additional history
provided: Intermittent frontal headaches over the last 4 months.
Outpatient CT showing isodense 3.2 cm mass with surrounding
vasogenic edema.

EXAM:
MRI HEAD WITHOUT AND WITH CONTRAST
TECHNIQUE: Multiplanar, multiecho pulse sequences of the brain and surrounding
structures were obtained without and with intravenous contrast.
CONTRAST:  10mL GADAVIST GADOBUTROL 1 MMOL/ML IV SOLN

[Series 5: ax dwi_tracew · axial · 3.0mm · 0.71mm/px · z∈[-154,+8]mm · 4 of 56 slices shown]
[im 1/56]
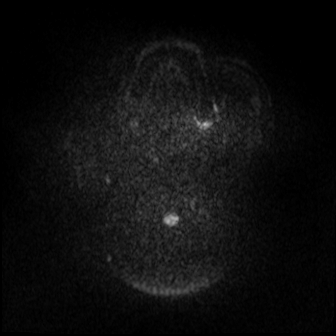
[im 19/56]
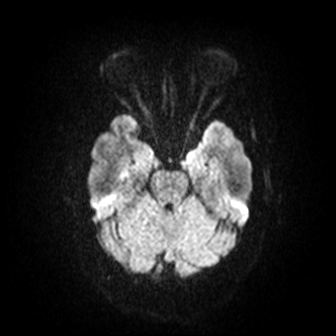
[im 37/56]
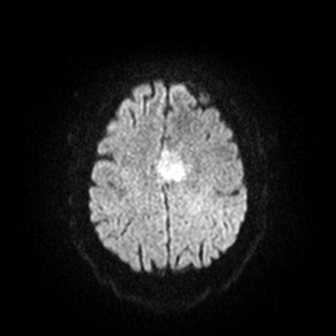
[im 56/56]
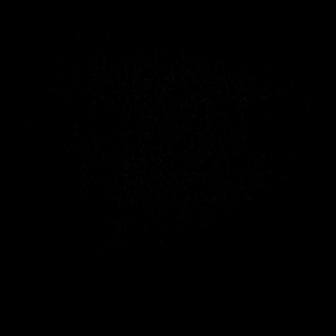

[Series 6: ax dwi_adc · axial · 3.0mm · 0.71mm/px · z∈[-154,-4]mm · 3 of 52 slices shown]
[im 1/52]
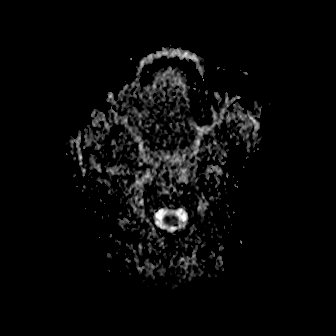
[im 26/52]
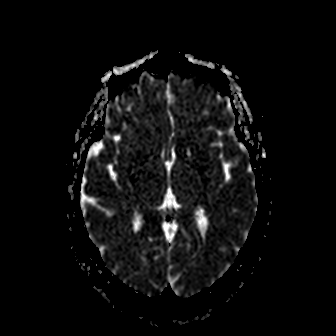
[im 52/52]
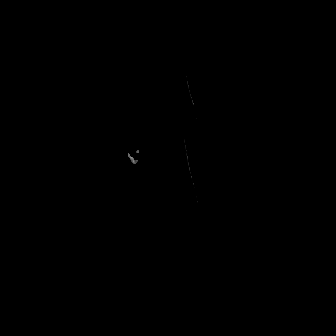

[Series 8: cor dwi_tracew · coronal · 5.0mm · 0.68mm/px · 2 of 40 slices shown]
[im 1/40]
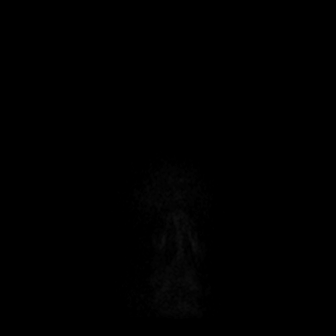
[im 40/40]
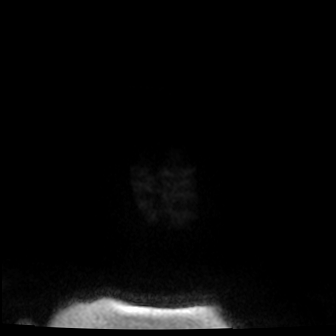

[Series 9: cor dwi_adc · coronal · 5.0mm · 0.68mm/px · 2 of 39 slices shown]
[im 1/39]
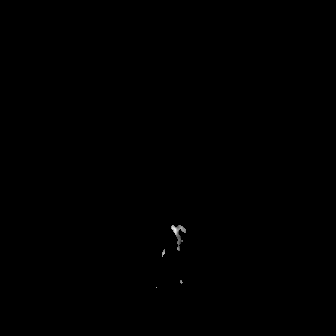
[im 39/39]
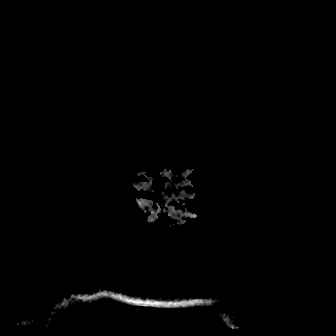

[Series 10: T1 · sagittal · 5.0mm · 0.62mm/px · 1 of 23 slices shown (1 of 2)]
[im 1/23]
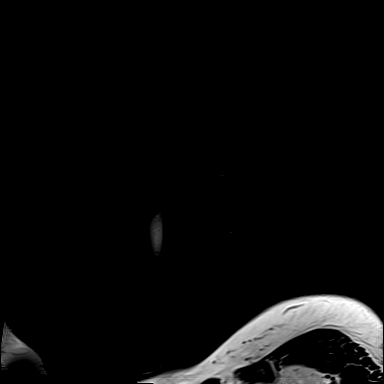

[Series 11: T2 · axial · 5.0mm · 0.53mm/px · 1 of 25 slices shown (1 of 2)]
[im 1/25]
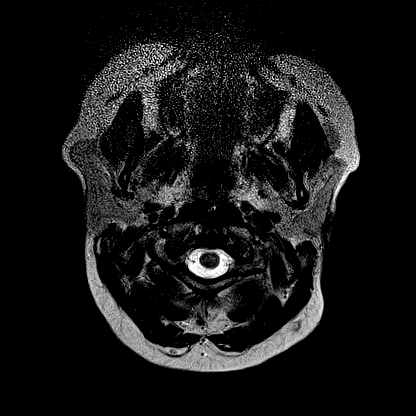

[Series 13: pha_images · axial · 3.0mm · 0.90mm/px · z∈[-143,+7]mm · 3 of 52 slices shown]
[im 1/52]
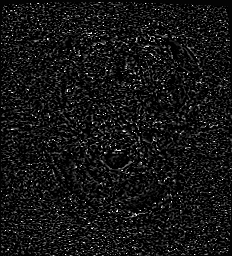
[im 26/52]
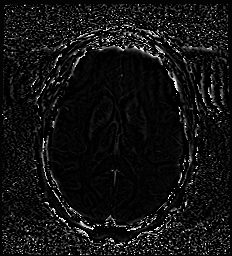
[im 52/52]
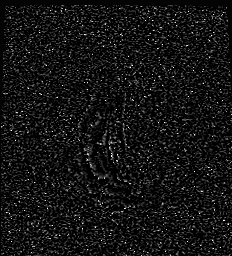

[Series 14: swi_images · axial · 3.0mm · 0.90mm/px · z∈[-143,+7]mm · 3 of 52 slices shown]
[im 1/52]
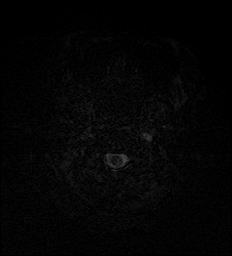
[im 26/52]
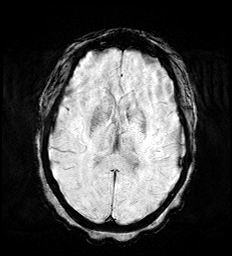
[im 52/52]
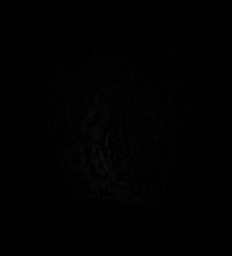

[Series 16: T2 · axial · 5.0mm · 0.53mm/px · 1 of 25 slices shown (2 of 2)]
[im 1/25]
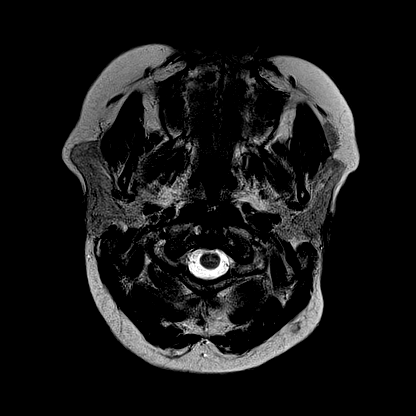

[Series 17: FLAIR · axial · 3.0mm · 0.53mm/px · z∈[-147,+12]mm · 3 of 55 slices shown]
[im 1/55]
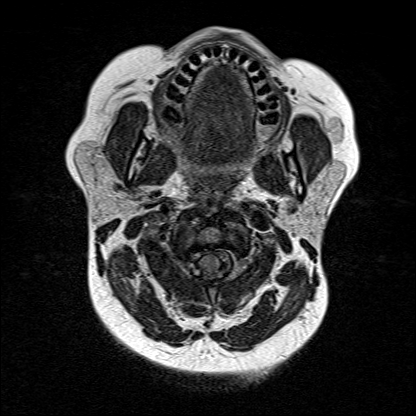
[im 28/55]
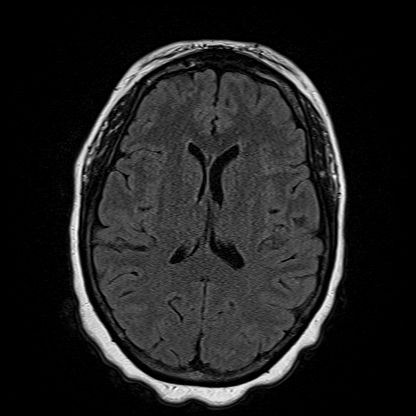
[im 55/55]
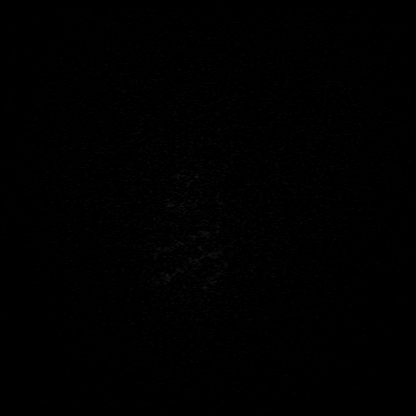

[Series 18: T1 · axial · 1.0mm · 0.98mm/px · z∈[-153,+19]mm · 10 of 176 slices shown (2 of 2)]
[im 1/176]
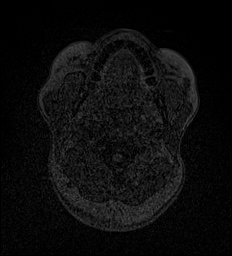
[im 20/176]
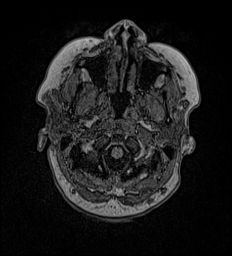
[im 39/176]
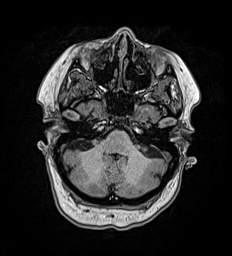
[im 59/176]
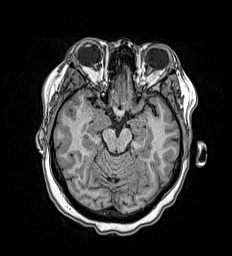
[im 78/176]
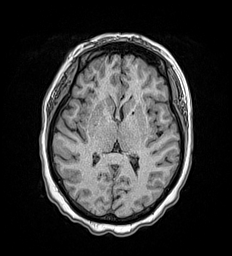
[im 98/176]
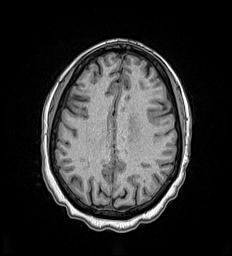
[im 117/176]
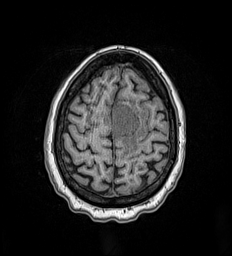
[im 137/176]
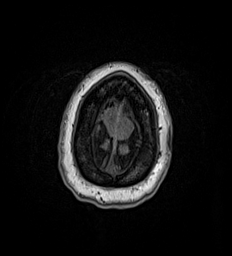
[im 156/176]
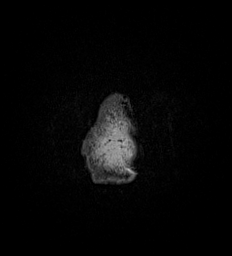
[im 176/176]
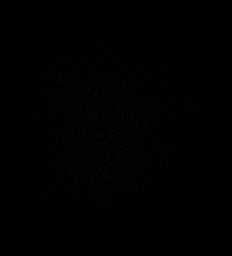

[Series 19: T2 post-contrast · coronal · 5.0mm · 0.57mm/px · 2 of 29 slices shown]
[im 1/29]
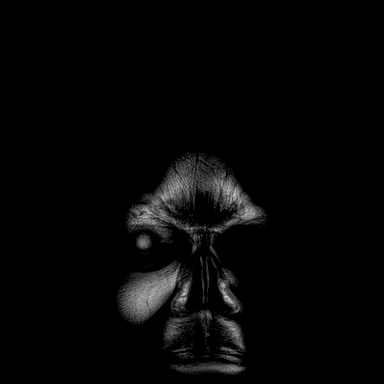
[im 29/29]
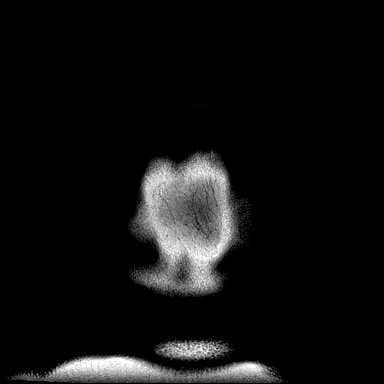

[Series 20: T1 post-contrast · axial · 1.0mm · 0.98mm/px · z∈[-153,+19]mm · 10 of 176 slices shown (1 of 3)]
[im 1/176]
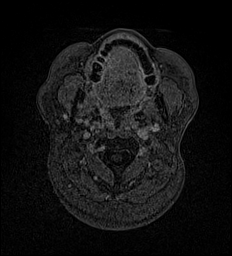
[im 20/176]
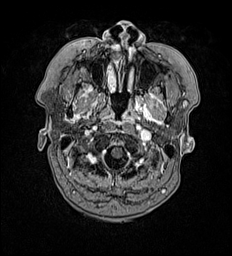
[im 39/176]
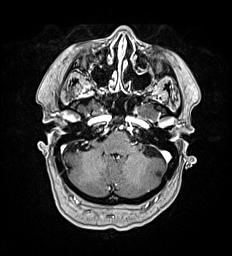
[im 59/176]
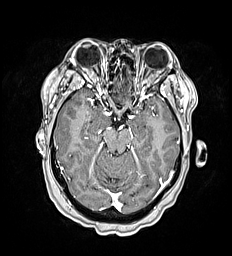
[im 78/176]
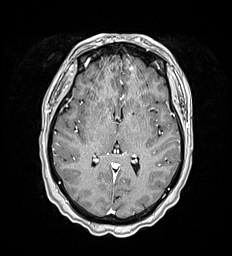
[im 98/176]
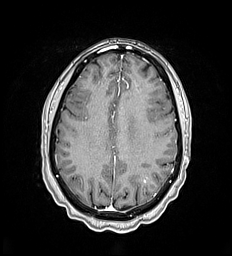
[im 117/176]
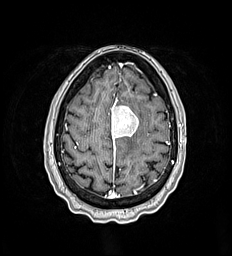
[im 137/176]
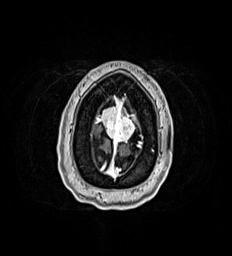
[im 156/176]
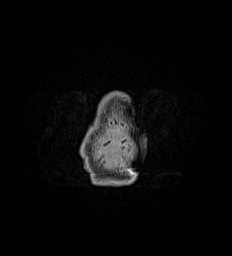
[im 176/176]
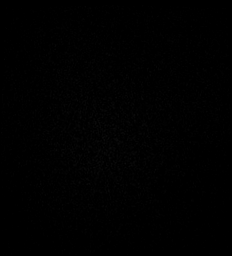

[Series 21: T1 post-contrast · coronal · 5.0mm · 0.57mm/px · 2 of 29 slices shown (2 of 3)]
[im 1/29]
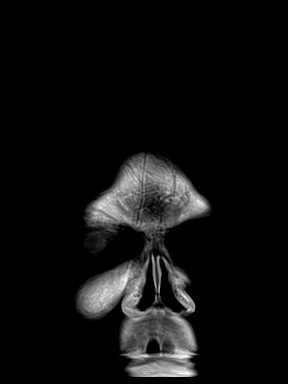
[im 29/29]
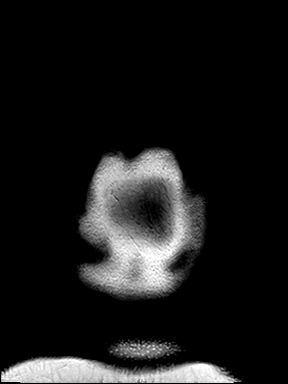

[Series 22: T1 post-contrast · sagittal · 5.0mm · 0.62mm/px · 1 of 23 slices shown (3 of 3)]
[im 1/23]
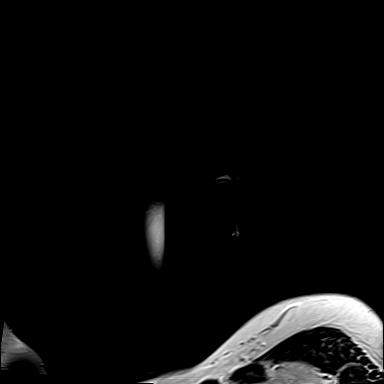

[48 of 48 positions shown; findings below may reference images not displayed]

FINDINGS: Brain:

Cerebral volume appears normal for age.

There is a 3.8 x 4.1 x 3.7 cm extra-axial, dural-based mass centered
along the mid-falx. The mass demonstrates somewhat heterogeneous
enhancement. A greater volume of tumor is present to the left of the
falx than to the right. There is suspected invasion of the adjacent
superior sagittal sinus. Mass effect on the posterior frontal lobes
(left greater than right). Edema within the underlying frontal
lobes, moderate on the left and mild on the right. Partial
effacement of the lateral ventricles. 2 mm rightward midline shift
measured at the level the septum pellucidum.

Prominent perivascular space versus chronic lacunar infarct within
the anterior limb of left internal capsule (series 16, image 12).

Partially empty sella turcica.

No cortical encephalomalacia is identified. There is no acute
infarct. No chronic intracranial blood products are identified,
although susceptibility artifact along the left frontoparietal
vertex limits evaluation. No extra-axial fluid collection.

Vascular: Maintained flow voids within the proximal large arterial
vessels. Developmental venous anomaly within the left parietal lobe
(anatomic variant).

Skull and upper cervical spine: No focal suspicious marrow lesion.

Sinuses/Orbits: Visualized orbits show no acute finding. No
significant paranasal sinus disease.
IMPRESSION: 4.1 x 3.8 cm extra-axial dural-based mass centered along the
mid-falx, as described and favored to reflect a meningioma.
Suspected invasion of the adjacent superior sagittal sinus. Mass
effect upon the posterior frontal lobes, left greater than right.
Additionally, there is edema within the underlying frontal lobes,
moderate on the left and mild on the right. Partial effacement of
the lateral ventricles. 2 mm rightward midline shift measured at the
level of the septum pellucidum.

Prominent perivascular space versus chronic lacunar infarct within
the anterior limb of left internal capsule.

## 2021-01-07 IMAGING — MR MR MRV HEAD W/O CM
1 of 3 series · 26 of 48 positions shown · non-contrast
Comparison: No prior MRV, correlation is made with same day MRI
head with and without contrast

CLINICAL DATA: Dural venous sinus thrombosis suspected

EXAM:
MR VENOGRAM HEAD WITHOUT CONTRAST
TECHNIQUE: Angiographic images of the intracranial venous structures were
acquired using MRV technique without intravenous contrast.

[Series 5: TOF · coronal · 2.5mm · 0.98mm/px · 26 of 110 slices shown]
[im 1/110]
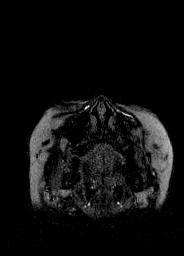
[im 5/110]
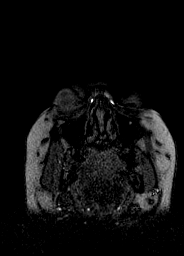
[im 9/110]
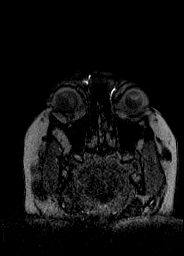
[im 14/110]
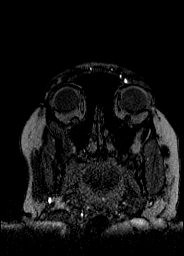
[im 18/110]
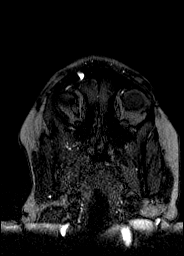
[im 22/110]
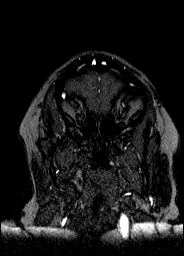
[im 27/110]
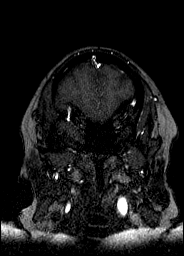
[im 31/110]
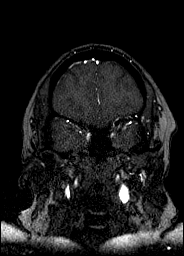
[im 35/110]
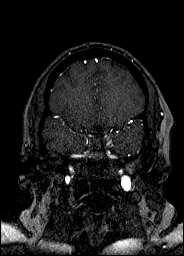
[im 40/110]
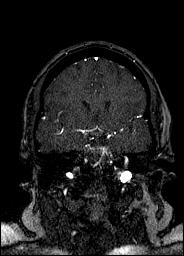
[im 44/110]
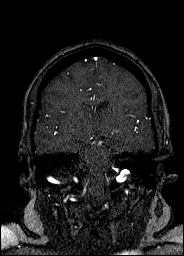
[im 48/110]
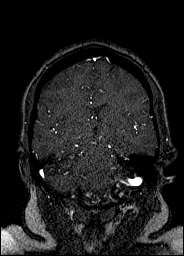
[im 53/110]
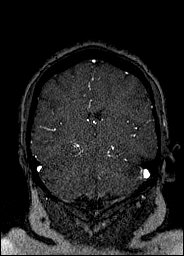
[im 57/110]
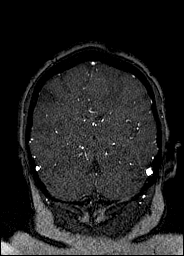
[im 62/110]
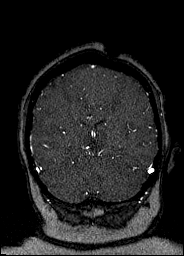
[im 66/110]
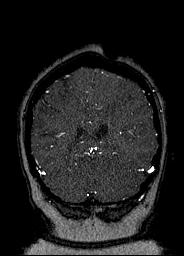
[im 70/110]
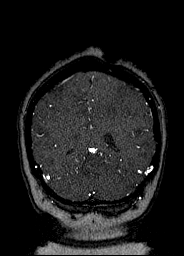
[im 75/110]
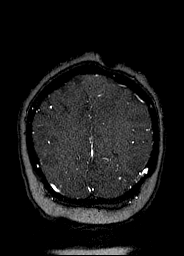
[im 79/110]
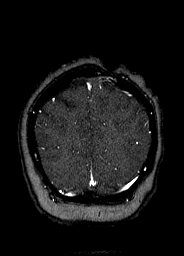
[im 83/110]
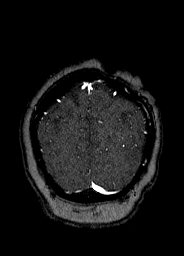
[im 88/110]
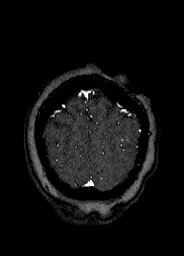
[im 92/110]
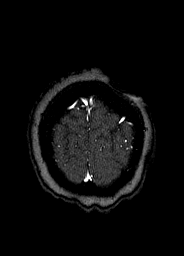
[im 96/110]
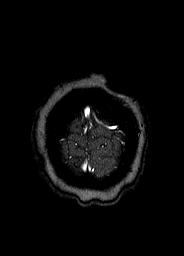
[im 101/110]
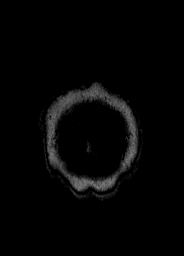
[im 105/110]
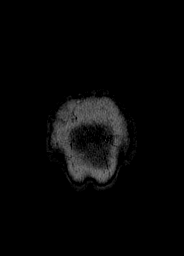
[im 110/110]
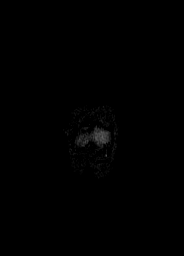

[26 of 48 positions shown; findings below may reference images not displayed]

FINDINGS: No signal is seen within the superior sagittal sinus in the location
of the dural based mass seen on the same-day MRI head (series 10,
image 22), likely reflecting invasion of the superior sagittal
sinus. The superior sagittal sinus is otherwise patent

There is no other evidence of dural venous sinus or deep cerebral
vein thrombosis. Focal narrowing in the right proximal transverse
sinus is felt to be artifactual, given contrast in this location on
the same-day MRI.
IMPRESSION: 1. Absence of signal in the superior sagittal sinus from the
location of the meningioma seen on the same-day MRI, likely
reflecting involvement of the superior sagittal sinus.
2. No other evidence of dural venous sinus thrombosis or stenosis.

## 2021-01-07 IMAGING — CR DG CHEST 2V
2 series · 2 of 2 positions shown · non-contrast
Comparison: Chest x-ray [DATE].

CLINICAL DATA: 65-year-old female with history of right-sided chest
pain.

EXAM:
CHEST - 2 VIEW

[chest pa]
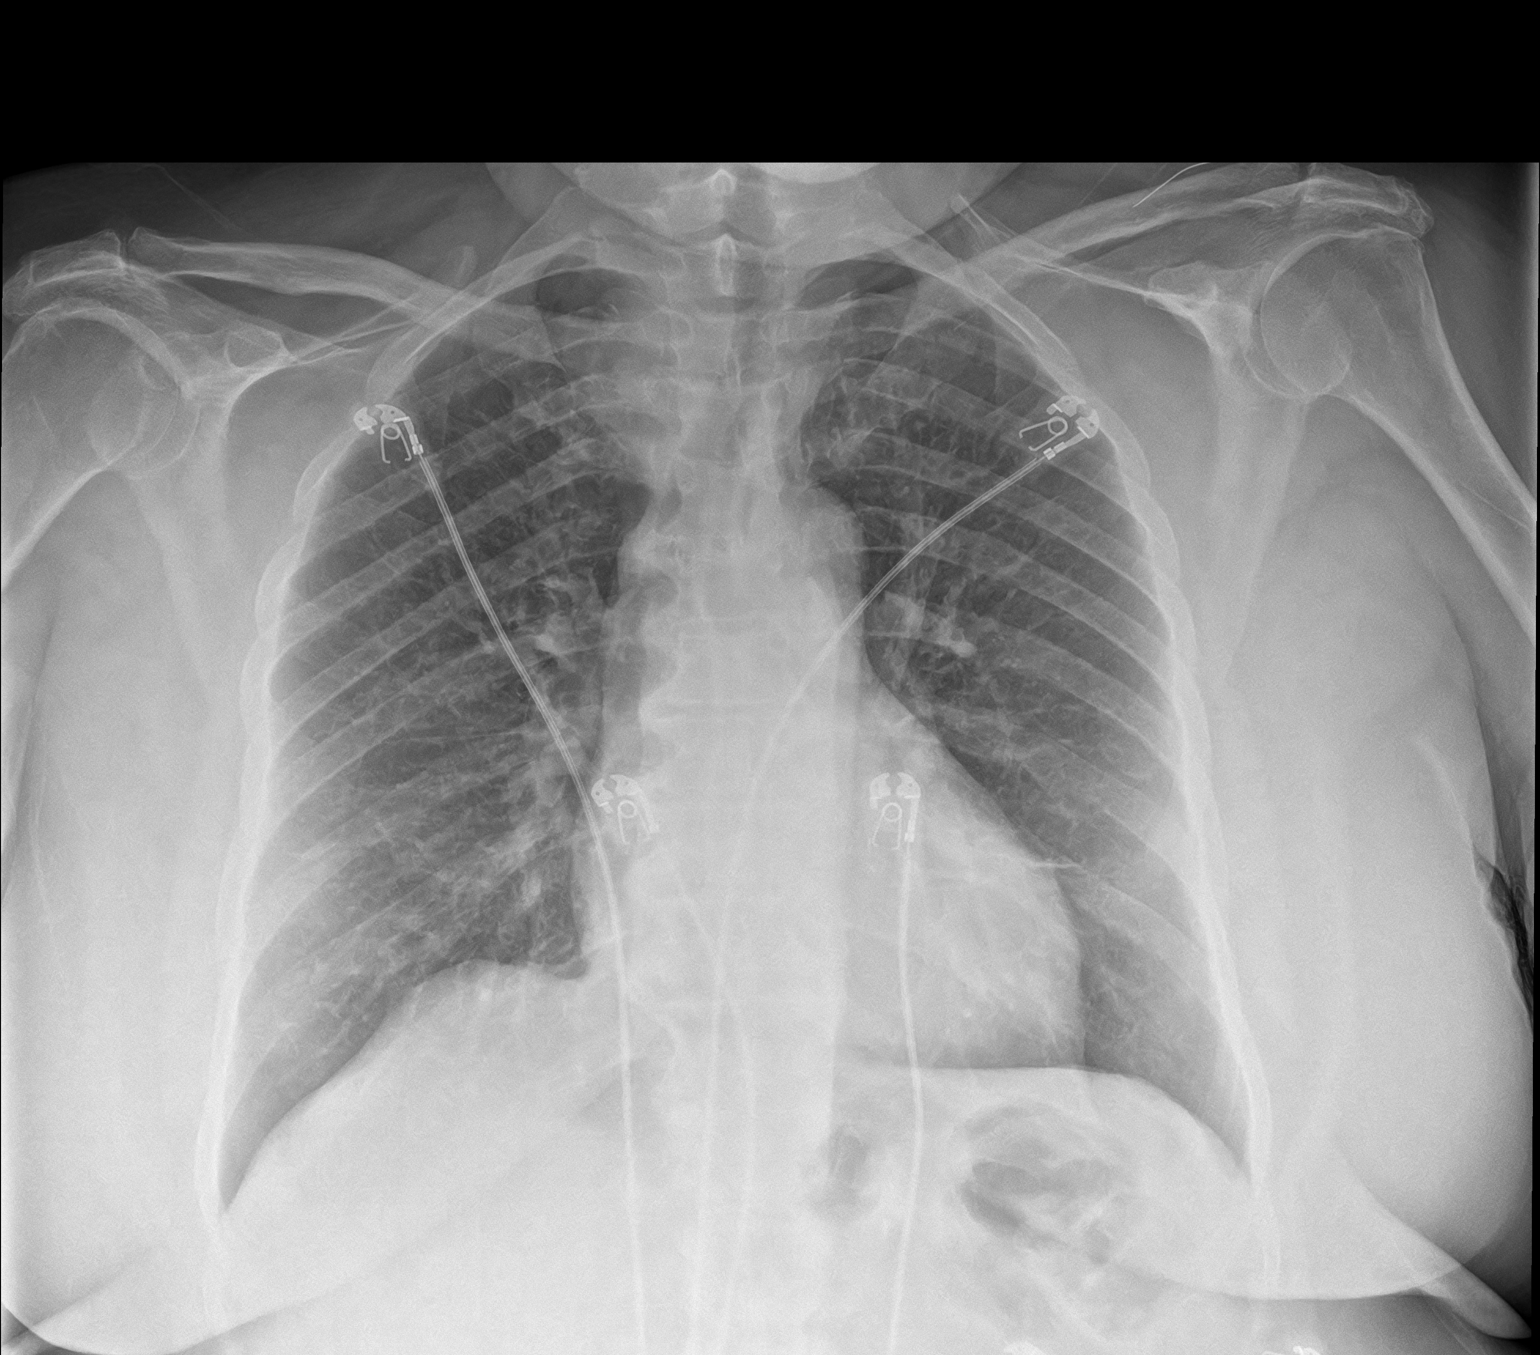

[chest lat]
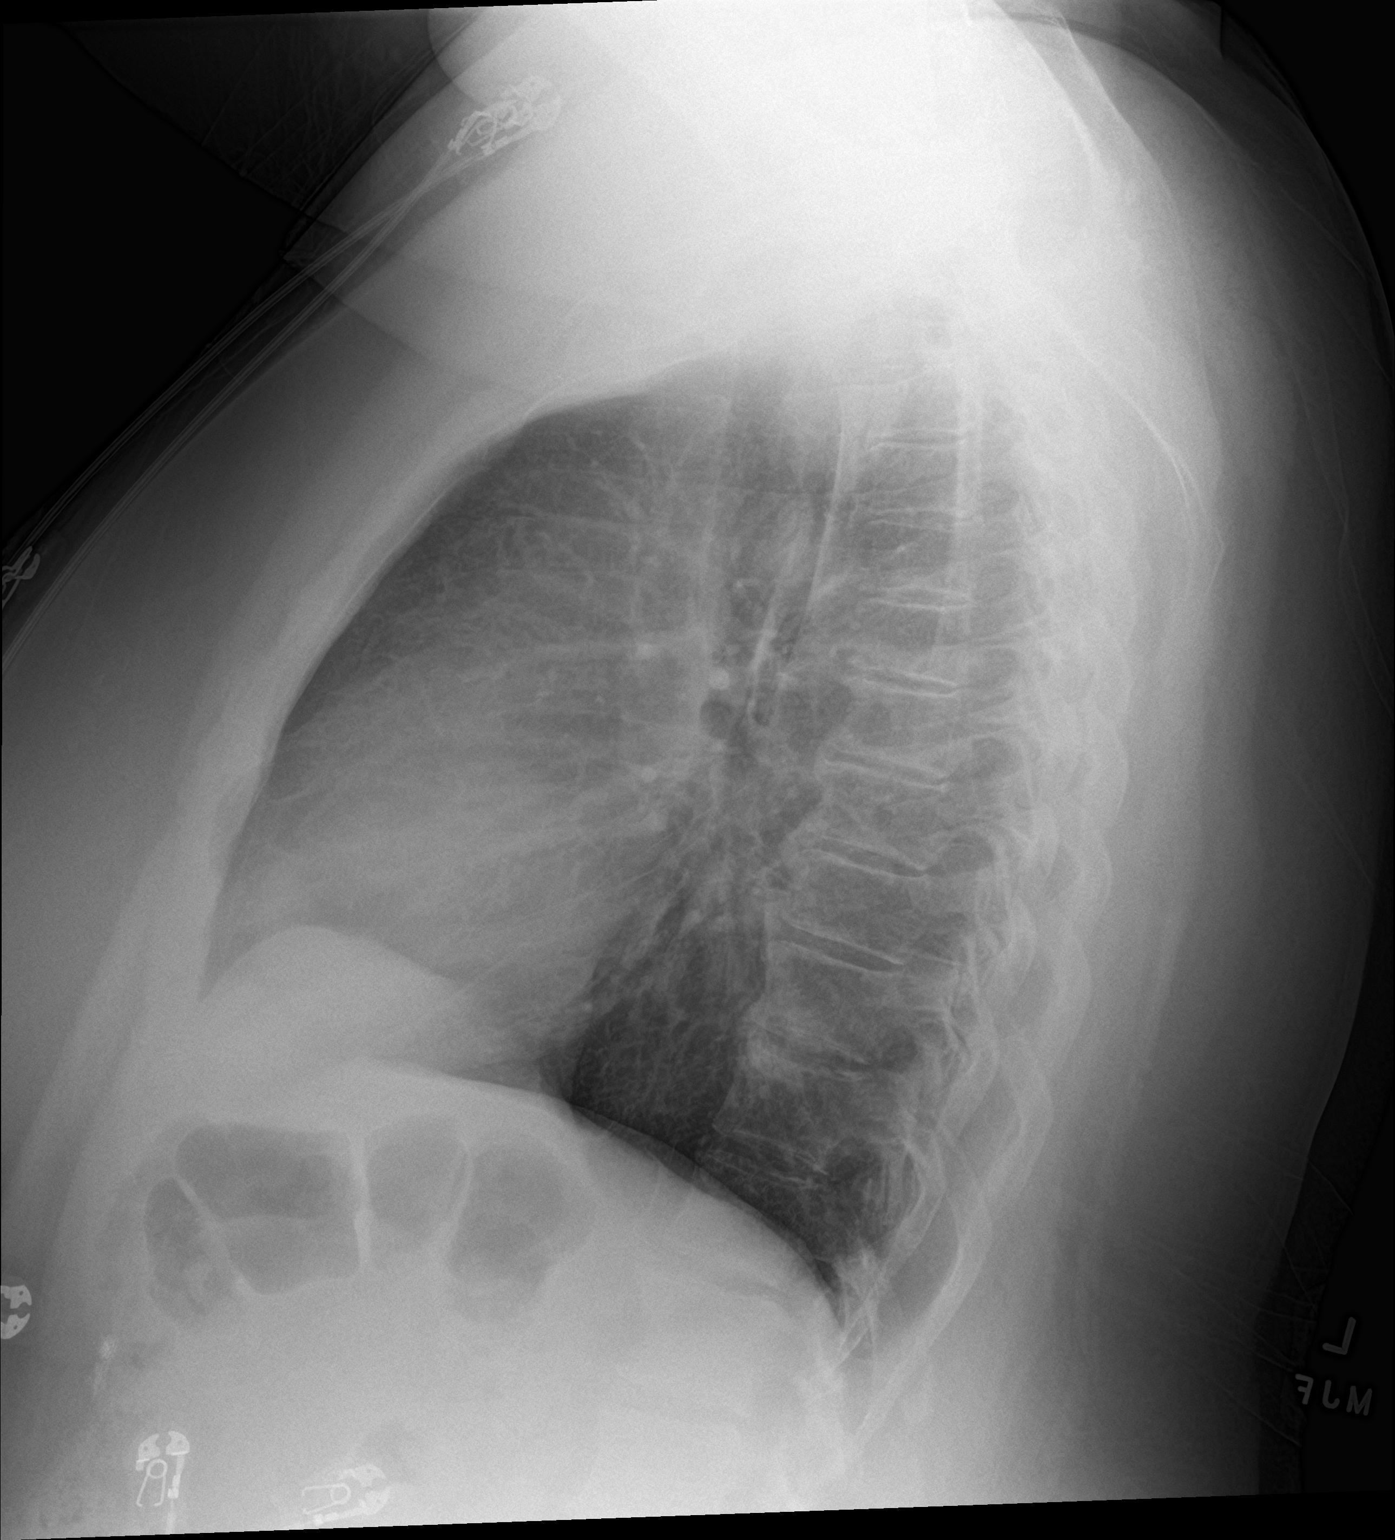

[2 of 2 positions shown; findings below may reference images not displayed]

FINDINGS: New masslike fullness in the high right paratracheal region causing
some deviation of the trachea to the left. Lung volumes are normal.
No consolidative airspace disease. No pleural effusions. Mild
diffuse interstitial prominence. No evidence of pulmonary edema.
Heart size is normal.
IMPRESSION: 1. New mass-like fullness in the high right paratracheal region
concerning for potential neoplasm or lymphadenopathy. Further
evaluation with contrast enhanced chest CT is recommended at this
time to better evaluate this finding.

## 2021-01-07 MED ORDER — IOHEXOL 350 MG/ML SOLN
100.0000 mL | Freq: Once | INTRAVENOUS | Status: AC | PRN
  Administered 2021-01-07: 80 mL via INTRAVENOUS

## 2021-01-07 MED ORDER — ONDANSETRON HCL 4 MG/2ML IJ SOLN
4.0000 mg | Freq: Four times a day (QID) | INTRAMUSCULAR | Status: DC | PRN
  Administered 2021-01-07 – 2021-01-09 (×5): 4 mg via INTRAVENOUS
  Filled 2021-01-07 (×6): qty 2

## 2021-01-07 MED ORDER — ALPRAZOLAM 0.5 MG PO TABS
0.5000 mg | ORAL_TABLET | Freq: Every evening | ORAL | Status: DC | PRN
  Administered 2021-01-08 – 2021-01-09 (×3): 0.5 mg via ORAL
  Filled 2021-01-07 (×3): qty 1

## 2021-01-07 MED ORDER — SODIUM CHLORIDE 0.9% FLUSH: 3.0000 mL | INTRAVENOUS | Status: DC | PRN

## 2021-01-07 MED ORDER — TAB-A-VITE/IRON PO TABS
1.0000 | ORAL_TABLET | Freq: Every day | ORAL | Status: DC
  Administered 2021-01-07 – 2021-01-10 (×4): 1 via ORAL
  Filled 2021-01-07 (×4): qty 1

## 2021-01-07 MED ORDER — SODIUM CHLORIDE 0.9 % IV SOLN: 250.0000 mL | INTRAVENOUS | Status: DC | PRN

## 2021-01-07 MED ORDER — DEXAMETHASONE SODIUM PHOSPHATE 10 MG/ML IJ SOLN: 8.0000 mg | Freq: Three times a day (TID) | INTRAMUSCULAR | Status: DC

## 2021-01-07 MED ORDER — DEXAMETHASONE SODIUM PHOSPHATE 10 MG/ML IJ SOLN
8.0000 mg | Freq: Three times a day (TID) | INTRAMUSCULAR | Status: DC
  Administered 2021-01-07 – 2021-01-10 (×8): 8 mg via INTRAVENOUS
  Filled 2021-01-07 (×7): qty 1

## 2021-01-07 MED ORDER — DEXAMETHASONE SODIUM PHOSPHATE 10 MG/ML IJ SOLN
10.0000 mg | Freq: Once | INTRAMUSCULAR | Status: AC
  Administered 2021-01-07: 10 mg via INTRAVENOUS
  Filled 2021-01-07: qty 1

## 2021-01-07 MED ORDER — LORAZEPAM 2 MG/ML IJ SOLN
1.0000 mg | Freq: Once | INTRAMUSCULAR | Status: AC
  Administered 2021-01-07: 1 mg via INTRAVENOUS
  Filled 2021-01-07: qty 1

## 2021-01-07 MED ORDER — MORPHINE SULFATE (PF) 2 MG/ML IV SOLN
2.0000 mg | INTRAVENOUS | Status: DC | PRN
  Administered 2021-01-07 – 2021-01-09 (×6): 2 mg via INTRAVENOUS
  Filled 2021-01-07 (×7): qty 1

## 2021-01-07 MED ORDER — ACETAMINOPHEN 650 MG RE SUPP: 650.0000 mg | Freq: Four times a day (QID) | RECTAL | Status: DC | PRN

## 2021-01-07 MED ORDER — CITALOPRAM HYDROBROMIDE 20 MG PO TABS
20.0000 mg | ORAL_TABLET | Freq: Every day | ORAL | Status: DC
  Administered 2021-01-07 – 2021-01-10 (×4): 20 mg via ORAL
  Filled 2021-01-07 (×4): qty 1

## 2021-01-07 MED ORDER — ACETAMINOPHEN 325 MG PO TABS: 650.0000 mg | ORAL_TABLET | Freq: Four times a day (QID) | ORAL | Status: DC | PRN

## 2021-01-07 MED ORDER — VITAMIN D3 25 MCG (1000 UNIT) PO TABS
5000.0000 [IU] | ORAL_TABLET | Freq: Every day | ORAL | Status: DC
  Administered 2021-01-07 – 2021-01-10 (×4): 5000 [IU] via ORAL
  Filled 2021-01-07 (×5): qty 5

## 2021-01-07 MED ORDER — SODIUM CHLORIDE 0.9% FLUSH
3.0000 mL | Freq: Two times a day (BID) | INTRAVENOUS | Status: DC
  Administered 2021-01-07 – 2021-01-10 (×7): 3 mL via INTRAVENOUS

## 2021-01-07 MED ORDER — GADOBUTROL 1 MMOL/ML IV SOLN
10.0000 mL | Freq: Once | INTRAVENOUS | Status: AC | PRN
  Administered 2021-01-07: 10 mL via INTRAVENOUS

## 2021-01-07 MED ORDER — ONDANSETRON HCL 4 MG/2ML IJ SOLN
4.0000 mg | Freq: Once | INTRAMUSCULAR | Status: AC
  Administered 2021-01-07: 4 mg via INTRAVENOUS
  Filled 2021-01-07: qty 2

## 2021-01-07 MED ORDER — MORPHINE SULFATE (PF) 4 MG/ML IV SOLN
4.0000 mg | Freq: Once | INTRAVENOUS | Status: AC
  Administered 2021-01-07: 4 mg via INTRAVENOUS
  Filled 2021-01-07: qty 1

## 2021-01-07 MED ORDER — ONDANSETRON HCL 4 MG PO TABS: 4.0000 mg | ORAL_TABLET | Freq: Four times a day (QID) | ORAL | Status: DC | PRN

## 2021-01-07 NOTE — Consult Note (Signed)
Neurosurgery-New Consultation Evaluation 01/07/2021 [name] [id number]  Identifying Statement: Regina Petty is a 65 y.o. female from [place] [id number] with a history of obesity, sleep apnea, and GERD.  Physician Requesting Consultation: No ref. provider found  History of Present Illness: Regina Petty is a 65 y.o presenting to the ER for right sided neck and shoulder pain for the last week. She states she initially thought this was due to an old rotator cuff injury but when it failed to improve with an injection she decided to undergo further evaluation. She does report intermittent frontal headaches for the last several months without any inciting event.  She states that she gets sharp pains in the front of her head that resolve on their own.  She denies any change in vision, nausea, vomiting or photophobia associated with these headaches.  She states that she has a family history of aneurysms in 2 of her siblings and requested further evaluation.  She did undergo an outpatient head CT on 01/01/32 which showed a isodense lesion with surrounding vasogenic edema. CT angiogram of the chest shows no evidence of pulmonary embolism.  Asymmetric masslike enlargement of the right lobe of the thyroid gland which has clearly progressed compared to prior examinations based on old chest radiographs.  This finding is concerning for potential primary thyroid neoplasm.  Mildly enlarged subcarinal lymph node.  The possibility of metastatic disease should be considered. 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  Past Medical History:  Past Medical History:  Diagnosis Date   Depressive disorder    GERD (gastroesophageal reflux disease)    Hiatal hernia    Macromastia    Obesity    Post-menopausal bleeding    Sleep apnea     Social History: Social History    Socioeconomic History   Marital status: Widowed    Spouse name: Not on file   Number of children: Not on file   Years of education: Not on file   Highest education level: Not on file  Occupational History   Not on file  Tobacco Use   Smoking status: Former    Types: Cigarettes    Quit date: 18    Years since quitting: 27.9   Smokeless tobacco: Never  Vaping Use   Vaping Use: Never used  Substance and Sexual Activity   Alcohol use: Yes   Drug use: No   Sexual activity: Not on file  Other Topics Concern   Not on file  Social History Narrative   Not on file   Social Determinants of Health   Financial Resource Strain: Not on file  Food Insecurity: Not on file  Transportation Needs: Not on file  Physical Activity: Not on file  Stress: Not on file  Social Connections: Not on file  Intimate Partner Violence: Not on file    Family History: Family History  Problem Relation Age of Onset   Heart failure Mother    Stroke Father    Diabetes Father     Review of Systems:  Review of Systems - General ROS: Negative Psychological ROS: Negative Ophthalmic ROS: Negative ENT ROS: Negative Hematological and Lymphatic ROS: Negative  Endocrine ROS: Negative Respiratory ROS: Negative Cardiovascular ROS: Negative Gastrointestinal ROS: Negative Genito-Urinary ROS: Negative Musculoskeletal ROS: Negative Neurological ROS: Negative Dermatological ROS: Negative  Physical Exam: BP 128/67   Pulse 75   Temp 97.8 F (36.6 C) (Oral)  Resp 16   Ht 5\' 4"  (1.626 m)   Wt 107 kg   SpO2 93%   BMI 40.51 kg/m  Body mass index is 40.51 kg/m. Body surface area is 2.2 meters squared. General appearance: Alert, cooperative, in no acute distress Head: Normocephalic, atraumatic Eyes: Normal, EOM intact Oropharynx: Moist without lesions Neck: Supple, no tenderness Heart: Normal, regular rate and rhythm, without murmur Lungs: Clear to auscultation, good air exchange Abdomen: Soft,  nondistended Ext: No edema in LE bilaterally, good distal pulses  Neurologic exam:  Mental status: alertness: alert, orientation: person, place, time, affect: normal Speech: fluent and clear Cranial nerves:  II: Visual fields are full by confrontation, no ptosis III/IV/VI: extra-ocular motions intact bilaterally V/VII:no evidence of facial droop or weakness and facial sensation intact VIII: hearing normal XI: trapezius strength symmetric,  sternocleidomastoid strength symmetric XII: tongue strength symmetric  Motor:strength symmetric 5/5, normal muscle mass and tone in all extremities except 4+/5 right tricep and deltoid and no pronator drift Sensory: intact to light touch in all extremities Reflexes: 2+ and symmetric bilaterally for arms and legs Coordination: intact finger to nose Gait: untested  Laboratory: Results for orders placed or performed during the hospital encounter of 01/07/21  CBC with Differential/Platelet  Result Value Ref Range   WBC 7.6 4.0 - 10.5 K/uL   RBC 4.21 3.87 - 5.11 MIL/uL   Hemoglobin 12.9 12.0 - 15.0 g/dL   HCT 41.7 36.0 - 46.0 %   MCV 99.0 80.0 - 100.0 fL   MCH 30.6 26.0 - 34.0 pg   MCHC 30.9 30.0 - 36.0 g/dL   RDW 12.0 11.5 - 15.5 %   Platelets 261 150 - 400 K/uL   nRBC 0.0 0.0 - 0.2 %   Neutrophils Relative % 36 %   Neutro Abs 2.8 1.7 - 7.7 K/uL   Lymphocytes Relative 53 %   Lymphs Abs 4.0 0.7 - 4.0 K/uL   Monocytes Relative 7 %   Monocytes Absolute 0.5 0.1 - 1.0 K/uL   Eosinophils Relative 3 %   Eosinophils Absolute 0.2 0.0 - 0.5 K/uL   Basophils Relative 1 %   Basophils Absolute 0.1 0.0 - 0.1 K/uL   Immature Granulocytes 0 %   Abs Immature Granulocytes 0.01 0.00 - 0.07 K/uL  Basic metabolic panel  Result Value Ref Range   Sodium 137 135 - 145 mmol/L   Potassium 4.5 3.5 - 5.1 mmol/L   Chloride 107 98 - 111 mmol/L   CO2 24 22 - 32 mmol/L   Glucose, Bld 87 70 - 99 mg/dL   BUN 21 8 - 23 mg/dL   Creatinine, Ser 0.83 0.44 - 1.00 mg/dL    Calcium 8.6 (L) 8.9 - 10.3 mg/dL   GFR, Estimated >60 >60 mL/min   Anion gap 6 5 - 15  HIV Antibody (routine testing w rflx)  Result Value Ref Range   HIV Screen 4th Generation wRfx Non Reactive Non Reactive  Troponin I (High Sensitivity)  Result Value Ref Range   Troponin I (High Sensitivity) 3 <18 ng/L   I personally reviewed labs  Imaging: Results for orders placed during the hospital encounter of 01/07/21  MR Brain W and Wo Contrast  Narrative CLINICAL DATA:  Brain metastases suspected. Additional history provided: Intermittent frontal headaches over the last 4 months. Outpatient CT showing isodense 3.2 cm mass with surrounding vasogenic edema.  EXAM: MRI HEAD WITHOUT AND WITH CONTRAST  TECHNIQUE: Multiplanar, multiecho pulse sequences of the brain and surrounding structures were  obtained without and with intravenous contrast.  CONTRAST:  56mL GADAVIST GADOBUTROL 1 MMOL/ML IV SOLN  COMPARISON:  No pertinent prior exams available for comparison.  FINDINGS: Brain:  Cerebral volume appears normal for age.  There is a 3.8 x 4.1 x 3.7 cm extra-axial, dural-based mass centered along the mid-falx. The mass demonstrates somewhat heterogeneous enhancement. A greater volume of tumor is present to the left of the falx than to the right. There is suspected invasion of the adjacent superior sagittal sinus. Mass effect on the posterior frontal lobes (left greater than right). Edema within the underlying frontal lobes, moderate on the left and mild on the right. Partial effacement of the lateral ventricles. 2 mm rightward midline shift measured at the level the septum pellucidum.  Prominent perivascular space versus chronic lacunar infarct within the anterior limb of left internal capsule (series 16, image 12).  Partially empty sella turcica.  No cortical encephalomalacia is identified. There is no acute infarct. No chronic intracranial blood products are  identified, although susceptibility artifact along the left frontoparietal vertex limits evaluation. No extra-axial fluid collection.  Vascular: Maintained flow voids within the proximal large arterial vessels. 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  Skull and upper cervical spine: No focal suspicious marrow lesion.  Sinuses/Orbits: Visualized orbits show no acute finding. No significant paranasal sinus disease.  IMPRESSION: 4.1 x 3.8 cm extra-axial dural-based mass centered along the mid-falx, as described and favored to reflect a meningioma. 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 Mass effect upon the posterior frontal lobes, left greater than right. Additionally, there is edema within the underlying frontal lobes, moderate on the left and mild on the right. Partial effacement of the lateral ventricles. 2 mm rightward midline shift measured at the level of the septum pellucidum.  Prominent perivascular space versus chronic lacunar infarct within the anterior limb of left internal capsule.   Electronically Signed By: Kellie Simmering D.O. On: 01/07/2021 12:20  I personally reviewed radiology studies to include:   Impression/Plan:     1.  Diagnosis: extra-axial dural-based mass centered along the mid-falx; likely meningioma   2.  Plan: - MRV for further evaluation  - plan for outpatient follow up with Dr. Lacinda Axon for further discussion about treatment of cranial mass. - further work up for thyroid mass per hospitalist   Cooper Render PA-C Neurosurgery

## 2021-01-07 NOTE — ED Notes (Signed)
Patient at MRI 

## 2021-01-07 NOTE — H&P (Signed)
History and Physical    PAGE PUCCIARELLI MMH:[id number] DOB: Jan 09, 1956 DOA: 01/07/2021  PCP: Romualdo Bolk, FNP   Patient coming from: Home  I have personally briefly reviewed patient's old medical records in [place]  Chief Complaint: Right shoulder pain  HPI: Regina Petty is a 65 y.o. female with medical history significant for obesity with complications of sleep apnea, GERD with hiatal hernia who presents to the ER for evaluation of pain in the right side of her neck, right shoulder and arm which she rated a 10 x 10 in intensity at its worst.  She states that she has had pain for about 4 days and decided to present to the ER for further evaluation due to worsening and increased intensity of the pain. She also gives a history of intermittent frontal headaches which she has had over the last 4 months.  She rates the headaches an 8 x 10 in intensity at its worst and denies having any associated nausea, no vomiting or photosensitivity.  She denies having a history of migraines.  She had an outpatient CT scan of the head done due to family history of aneurysms and persistent headache which showed an isodense lesion measuring 3.2 x 3.1 x 3.0 cm with surrounding vasogenic edema located in the interhemispheric fissure.  No evidence of an acute infarct.  No acute intracranial hemorrhage. She also complains of chest pressure but denies having any shortness of breath, no palpitations or diaphoresis. She denies having any fever, no chills, no cough, no dizziness, no lightheadedness, no urinary frequency, no nocturia, no dysuria, no abdominal pain or changes in her bowel habits, no leg swelling, no blurred vision or focal deficit.  She denies having any weight loss or anorexia. Sodium 137, potassium 4.5, chloride 107, bicarb 24, glucose 87, BUN 21, creatinine 0.83, calcium 8.6, white count 7.6, hemoglobin 12.9, hematocrit 41.7, MCV 99, RDW 12.0, platelet count 261 Chest x-ray reviewed by me  shows new masslike fullness in the high right paratracheal region concerning for potential neoplasm or lymphadenopathy. CT angiogram of the chest shows no evidence of pulmonary embolism.  Asymmetric masslike enlargement of the right lobe of the thyroid gland which has clearly progressed compared to prior examinations based on old chest radiographs.  This finding is concerning for potential primary thyroid neoplasm.  Mildly enlarged subcarinal lymph node.  The possibility of metastatic disease should be considered. 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 Twelve-lead EKG reviewed by me shows sinus rhythm.     ED Course: Patient is a 65 year old female who presents to the ER for evaluation of 4-day history of right shoulder and right-sided neck pain. Chest x-ray shows masslike fullness in the high right paratracheal region concerning for potential neoplasm. CT angiogram shows asymmetric masslike enlargement of the right lobe of the thyroid gland concerning for potential primary thyroid neoplasm. Patient had a CT scan of the head without contrast done at an outside hospital which showed an isodense lesion measuring 3.2 x 3.1 x 3.0 cm with surrounding vasogenic edema located in the interhemispheric fissure.  No evidence of an acute infarct.  No acute intracranial hemorrhage. Oncology was consulted in the ER and she will be admitted to the hospital for further evaluation.   Review of Systems: As per HPI otherwise all other systems reviewed and negative.    Past Medical History:  Diagnosis Date   Depressive  disorder    GERD (gastroesophageal reflux disease)    Hiatal hernia    Macromastia    Obesity    Post-menopausal bleeding    Sleep apnea     Past Surgical History:  Procedure Laterality Date   BARIATRIC SURGERY     buionectomy     COLON SURGERY      ENDOMETRIAL ABLATION     gastectomy     GASTRIC RESTRICTION SURGERY     LAPAROSCOPIC PARTIAL PROTECTOMY     NASAL SINUS SURGERY     TUBAL LIGATION     UGE       reports that she quit smoking about 27 years ago. She has never used smokeless tobacco. She reports current alcohol use. She reports that she does not use drugs.  Allergies  Allergen Reactions   Hydrocodone Nausea And Vomiting   Latex Rash   Nickel Rash    Family History  Problem Relation Age of Onset   Heart failure Mother    Stroke Father    Diabetes Father       Prior to Admission medications   Medication Sig Start Date End Date Taking? Authorizing Provider  ALPRAZolam Duanne Moron) 0.5 MG tablet Take 0.5 mg by mouth at bedtime as needed for anxiety.    [provider]  amoxicillin (AMOXIL) 875 MG tablet Take 1 tablet (875 mg total) by mouth 2 (two) times daily. Patient not taking: Reported on 01/07/2021 06/28/15   Norval Gable, MD  citalopram (CELEXA) 20 MG tablet Take 20 mg by mouth daily.    [provider]  fluticasone (FLONASE) 50 MCG/ACT nasal spray Place into both nostrils daily.    [provider]  oxyCODONE-acetaminophen (PERCOCET/ROXICET) 5-325 MG tablet 1-2 tabs po bid prn 06/10/17   Norval Gable, MD    Physical Exam: Vitals:   01/07/21 0530 01/07/21 0629 01/07/21 0630 01/07/21 0721  BP: 139/79 136/70 (!) 143/72 (!) 152/82  Pulse: (!) 57 61 (!) 57 71  Resp: 13 11 10 13   Temp:      TempSrc:      SpO2: 98% 100% 98% 98%  Weight:      Height:         Vitals:   01/07/21 0530 01/07/21 0629 01/07/21 0630 01/07/21 0721  BP: 139/79 136/70 (!) 143/72 (!) 152/82  Pulse: (!) 57 61 (!) 57 71  Resp: 13 11 10 13   Temp:      TempSrc:      SpO2: 98% 100% 98% 98%  Weight:      Height:          Constitutional: Alert and oriented x 3 . Not in any apparent distress HEENT:      Head: Normocephalic and atraumatic.         Eyes: PERLA, EOMI, Conjunctivae are normal. Sclera is  non-icteric.       Mouth/Throat: Mucous membranes are moist.       Neck: Supple with no signs of meningismus. Cardiovascular: Regular rate and rhythm. No murmurs, gallops, or rubs. 2+ symmetrical distal pulses are present . No JVD. No LE edema Respiratory: Respiratory effort normal .Lungs sounds clear bilaterally. No wheezes, crackles, or rhonchi.  Gastrointestinal: Soft, non tender, and non distended with positive bowel sounds.  Central adiposity Genitourinary: No CVA tenderness. Musculoskeletal: Nontender with normal range of motion in all extremities. No cyanosis, or erythema of extremities. Neurologic:  Face is symmetric. Moving all extremities. No gross focal neurologic deficits . Skin: Skin is warm, dry.  No rash or ulcers Psychiatric: Mood and affect are normal    Labs on Admission: I have personally reviewed following labs and imaging studies  CBC: Recent Labs  Lab 01/07/21 0517  WBC 7.6  NEUTROABS 2.8  HGB 12.9  HCT 41.7  MCV 99.0  PLT 329   Basic Metabolic Panel: Recent Labs  Lab 01/07/21 0517  NA 137  K 4.5  CL 107  CO2 24  GLUCOSE 87  BUN 21  CREATININE 0.83  CALCIUM 8.6*   GFR: Estimated Creatinine Clearance: 82.8 mL/min (by C-G formula based on SCr of 0.83 mg/dL). Liver Function Tests: No results for input(s): AST, ALT, ALKPHOS, BILITOT, PROT, ALBUMIN in the last 168 hours. No results for input(s): LIPASE, AMYLASE in the last 168 hours. No results for input(s): AMMONIA in the last 168 hours. Coagulation Profile: No results for input(s): INR, PROTIME in the last 168 hours. Cardiac Enzymes: No results for input(s): CKTOTAL, CKMB, CKMBINDEX, TROPONINI in the last 168 hours. BNP (last 3 results) No results for input(s): PROBNP in the last 8760 hours. HbA1C: No results for input(s): HGBA1C in the last 72 hours. CBG: No results for input(s): GLUCAP in the last 168 hours. Lipid Profile: No results for input(s): CHOL, HDL, LDLCALC, TRIG, CHOLHDL,  LDLDIRECT in the last 72 hours. Thyroid Function Tests: No results for input(s): TSH, T4TOTAL, FREET4, T3FREE, THYROIDAB in the last 72 hours. Anemia Panel: No results for input(s): VITAMINB12, FOLATE, FERRITIN, TIBC, IRON, RETICCTPCT in the last 72 hours. Urine analysis: No results found for: COLORURINE, APPEARANCEUR, LABSPEC, PHURINE, GLUCOSEU, HGBUR, BILIRUBINUR, KETONESUR, PROTEINUR, UROBILINOGEN, NITRITE, LEUKOCYTESUR  Radiological Exams on Admission: DG Chest 2 View  Result Date: 01/07/2021 CLINICAL DATA:  65 year old female with history of right-sided chest pain. EXAM: CHEST - 2 VIEW COMPARISON:  Chest x-ray 10/26/2013. FINDINGS: New masslike fullness in the high right paratracheal region causing some deviation of the trachea to the left. Lung volumes are normal. No consolidative airspace disease. No pleural effusions. Mild diffuse interstitial prominence. No evidence of pulmonary edema. Heart size is normal. IMPRESSION: 1. New mass-like fullness in the high right paratracheal region concerning for potential neoplasm or lymphadenopathy. Further evaluation with contrast enhanced chest CT is recommended at this time to better evaluate this finding. Electronically Signed   By: Vinnie Langton M.D.   On: 01/07/2021 06:16   DG Shoulder Right  Result Date: 01/07/2021 CLINICAL DATA:  65 year old female with history of right-sided shoulder pain. EXAM: RIGHT SHOULDER - 2+ VIEW COMPARISON:  Right shoulder radiographs 06/10/2017. FINDINGS: Three views of the right shoulder demonstrate no acute displaced fracture or dislocation. Humeral head is slightly high riding, suggesting underlying rotator cuff pathology. There is joint space narrowing, subchondral sclerosis and osteophyte formation in the glenohumeral and acromioclavicular joints, compatible with osteoarthritis. IMPRESSION: 1. No acute radiographic abnormality of the right shoulder. 2. Chronic degenerative changes of osteoarthritis in the  glenohumeral and acromioclavicular joints, and evidence of probable chronic rotator cuff disease, as above. Electronically Signed   By: Vinnie Langton M.D.   On: 01/07/2021 06:17   CT Angio Chest PE W and/or Wo Contrast  Result Date: 01/07/2021 CLINICAL DATA:  65 year old female with history of chest pain. Possible mass noted on chest x-ray. Follow-up study. EXAM: CT ANGIOGRAPHY CHEST WITH CONTRAST TECHNIQUE: Multidetector CT imaging of the chest was performed using the standard protocol during bolus administration of intravenous contrast. Multiplanar CT image reconstructions and MIPs were obtained to evaluate the vascular anatomy. CONTRAST:  63mL OMNIPAQUE IOHEXOL 350  MG/ML SOLN COMPARISON:  No priors.  Chest x-ray 01/07/2021. FINDINGS: Cardiovascular: No filling defects are noted within the pulmonary arterial tree to suggest pulmonary embolism. Heart size is normal. There is no significant pericardial fluid, thickening or pericardial calcification. No atherosclerotic calcifications are noted in the thoracic aorta or the coronary arteries. Mediastinum/Nodes: The previously described high right paratracheal mass corresponds to asymmetric mass-like enlargement of the right lobe of the thyroid gland which is incompletely imaged on today's study, but estimated to measure at least 5.7 x 3.3 x 6.9 cm. Enlarged subcarinal lymph node also noted measuring 1.7 cm in short axis. Esophagus is unremarkable in appearance. No axillary lymphadenopathy. Lungs/Pleura: Status post sleeve gastrectomy. Upper Abdomen: There are no aggressive appearing lytic or blastic lesions noted in the visualized portions of the skeleton. Musculoskeletal: There are no aggressive appearing lytic or blastic lesions noted in the visualized portions of the skeleton. Review of the MIP images confirms the above findings. IMPRESSION: 1. No evidence of pulmonary embolism. 2. Asymmetric mass-like enlargement of the right lobe of the thyroid gland which  has clearly progressed compared to prior examinations based on old chest radiographs. This finding is concerning for potential primary thyroid neoplasm. Further clinical evaluation is recommended. 3. Mildly enlarged subcarinal lymph node. The possibility of metastatic disease should be considered. Electronically Signed   By: Vinnie Langton M.D.   On: 01/07/2021 07:40     Assessment/Plan Principal Problem:   Neck mass Active Problems:   Depressive disorder   GERD (gastroesophageal reflux disease)   Obesity, Class III, BMI 40-49.9 (morbid obesity) ([place])   Vasogenic brain edema [facility])     Patient is a 65 year old female who presents for evaluation of pain in her right shoulder and the right side of her neck and is found to have a thyroid mass concerning for thyroid neoplasm.   Thyroid mass Concerning for primary thyroid neoplasm with possible metastatic disease Will obtain CT scan of chest/abdomen and pelvis to rule out metastatic disease Patient had a CT scan of the head done as an outpatient for evaluation of headaches which showed evidence of vasogenic edema. Place patient on Decadron 8 mg IV every 8 per oncology recommendation Obtain MRI of the brain for further evaluation We will consult oncology     Morbid obesity (BMI 73.7 ) With complications of obstructive sleep apnea Complicates overall prognosis and care    Anxiety and depression Continue Celexa and as needed Xanax    DVT prophylaxis: SCD Code Status: full code  Family Communication: Greater than 50% of time was spent discussing patient's condition and plan of care with her and her daughter at the bedside.  All questions and concerns have been addressed.  They verbalized understanding and agree with the plan.  Disposition Plan: Back to previous home environment Consults called: Oncology Status:At the time of admission, it appears that the appropriate admission status for this patient is inpatient. This is judged  to be reasonable and necessary to provide the required intensity of service to ensure the patient's safety given the presenting symptoms, physical exam findings, and initial radiographic and laboratory data in the context of their comorbid conditions. Patient requires inpatient status due to high intensity of service, high risk for further deterioration and high frequency of surveillance required.     Collier Bullock MD [facility]     01/07/2021, 9:44 AM

## 2021-01-07 NOTE — ED Triage Notes (Signed)
Pt presents via home with chest pain that radiates down her right arm starting yesterday that has progressively gotten worse. Patient describes the pain as a "heavy pressure" on her chest and rates it a 10/10. She states she took 324 ASA PTA without relief and denies any cardiac hx.

## 2021-01-07 NOTE — ED Notes (Signed)
Report given to Meriel Pica, Therapist, sports.

## 2021-01-07 NOTE — ED Notes (Signed)
Pt to XR

## 2021-01-07 NOTE — ED Notes (Signed)
Patient sleeping at this time. NAD noted. Family at bedside.

## 2021-01-07 NOTE — ED Notes (Signed)
Pt in CT.

## 2021-01-07 NOTE — ED Provider Notes (Signed)
Discussed results with patient and family in the room.  Discussed case in consultation with Dr. Grayland Ormond of oncology who does recommend completing full body CT to evaluate further mets as well as MRI brain and admission to the hospital.  Also recommend IV Decadron.Marland Kitchen  Will consult hospitalist   Merlyn Lot, MD 01/07/21 [phone]

## 2021-01-07 NOTE — ED Notes (Signed)
Patient resting comfortably in stretcher at this time. NAD noted.

## 2021-01-07 NOTE — ED Provider Notes (Signed)
[facility] Emergency Department Provider Note  ____________________________________________   Event Date/Time   First MD Initiated Contact with Patient 01/07/21 0502     (approximate)  I have reviewed the triage vital signs and the nursing notes.   HISTORY  Chief Complaint Chest Pain    HPI Regina Petty is a 65 y.o. female with history of obesity, hiatal hernia, GERD, previous bariatric surgery who presents to the emergency department with complaints of right-sided shoulder, neck, upper back and upper chest pain.  States pain is difficult to describe and comes in waves and will make her catch her breath.  She states pain is worse with movement and palpation but she is able to lift this arm and get some relief.  She denies any known injury but states she does have history of rotator cuff disease and had an injection a few months ago.  She is left-hand dominant.  No nausea, vomiting, dizziness or diaphoresis.  No history of ACS, hypertension, diabetes, hyperlipidemia.  Was a previous smoker.  No history of PE, DVT, exogenous estrogen use, recent fractures, surgery, trauma, hospitalization, prolonged travel or other immobilization. No lower extremity swelling or pain. No calf tenderness.     Had CT head 12/31/20 ordered by PCP for concerns for intermittent HAs.  "FINDINGS:  There is an isodense lesion measuring 3.2 x 3.1 x 3.0 cm (3:25, 5:31) with surrounding vasogenic edema located in the interhemispheric fissure.   There is a 2 mm midline shift towards the right.  There is no evidence of acute infarct. No acute intracranial hemorrhage. No fractures are evident. The sinuses are pneumatized."    Past Medical History:  Diagnosis Date   Depressive disorder    GERD (gastroesophageal reflux disease)    Hiatal hernia    Macromastia    Obesity    Post-menopausal bleeding    Sleep apnea     There are no problems to display for this patient.   Past  Surgical History:  Procedure Laterality Date   BARIATRIC SURGERY     buionectomy     COLON SURGERY     ENDOMETRIAL ABLATION     gastectomy     GASTRIC RESTRICTION SURGERY     LAPAROSCOPIC PARTIAL PROTECTOMY     NASAL SINUS SURGERY     TUBAL LIGATION     UGE      Prior to Admission medications   Medication Sig Start Date End Date Taking? Authorizing Provider  ALPRAZolam Duanne Moron) 0.5 MG tablet Take 0.5 mg by mouth at bedtime as needed for anxiety.    [provider]  amoxicillin (AMOXIL) 875 MG tablet Take 1 tablet (875 mg total) by mouth 2 (two) times daily. 06/28/15   Norval Gable, MD  citalopram (CELEXA) 20 MG tablet Take 20 mg by mouth daily.    [provider]  fluticasone (FLONASE) 50 MCG/ACT nasal spray Place into both nostrils daily.    [provider]  oxyCODONE-acetaminophen (PERCOCET/ROXICET) 5-325 MG tablet 1-2 tabs po bid prn 06/10/17   Norval Gable, MD    Allergies Hydrocodone, Latex, and Nickel  Family History  Problem Relation Age of Onset   Heart failure Mother    Stroke Father    Diabetes Father     Social History Social History   Tobacco Use   Smoking status: Former    Types: Cigarettes    Quit date: 1995    Years since quitting: 27.9   Smokeless tobacco: Never  Vaping Use  Vaping Use: Never used  Substance Use Topics   Alcohol use: Yes   Drug use: No    Review of Systems Constitutional: No fever. Eyes: No visual changes. ENT: No sore throat. Cardiovascular: + chest pain. Respiratory: Denies shortness of breath. Gastrointestinal: No nausea, vomiting, diarrhea. Genitourinary: Negative for dysuria. Musculoskeletal: Negative for back pain. Skin: Negative for rash. Neurological: Negative for focal weakness or numbness.  ____________________________________________   PHYSICAL EXAM:  VITAL SIGNS: ED Triage Vitals  Enc Vitals Group     BP 01/07/21 0502 (!) 157/117     Pulse Rate 01/07/21 0502 63     Resp  01/07/21 0502 20     Temp 01/07/21 0502 97.8 F (36.6 C)     Temp Source 01/07/21 0502 Oral     SpO2 01/07/21 0502 97 %     Weight 01/07/21 0504 236 lb (107 kg)     Height 01/07/21 0504 5\' 4"  (1.626 m)     Head Circumference --      Peak Flow --      Pain Score 01/07/21 0503 10     Pain Loc --      Pain Edu? --      Excl. in [place]? --    CONSTITUTIONAL: Alert and oriented and responds appropriately to questions. Well-appearing; well-nourished HEAD: Normocephalic EYES: Conjunctivae clear, pupils appear equal, EOM appear intact ENT: normal nose; moist mucous membranes NECK: Supple, normal ROM CARD: RRR; S1 and S2 appreciated; no murmurs, no clicks, no rubs, no gallops CHEST:  Chest wall is tender to palpation over the anterior right chest wall.  No crepitus, ecchymosis, erythema, warmth, rash or other lesions present.   RESP: Normal chest excursion without splinting or tachypnea; breath sounds clear and equal bilaterally; no wheezes, no rhonchi, no rales, no hypoxia or respiratory distress, speaking full sentences ABD/GI: Normal bowel sounds; non-distended; soft, non-tender, no rebound, no guarding, no peritoneal signs, no hepatosplenomegaly BACK: The back appears normal, tender to palpation over the right trapezius muscle with associated muscle tightness but no redness, warmth, soft tissue swelling, ecchymosis or other lesions.  No midline spinal tenderness or step-off or deformity. EXT: Patient has limited range of motion in the right shoulder and has some discomfort with movement.  No loss of fullness or sign of dislocation.  2+ radial pulses bilaterally.  Compartments are soft.  No calf tenderness or calf swelling.  Otherwise normal ROM in all joints; no deformity noted, no edema; no cyanosis SKIN: Normal color for age and race; warm; no rash on exposed skin NEURO: Moves all extremities equally, normal sensation diffusely PSYCH: The patient's mood and manner are  appropriate.  ____________________________________________   LABS (all labs ordered are listed, but only abnormal results are displayed)  Labs Reviewed  BASIC METABOLIC PANEL - Abnormal; Notable for the following components:      Result Value   Calcium 8.6 (*)    All other components within normal limits  CBC WITH DIFFERENTIAL/PLATELET  TROPONIN I (HIGH SENSITIVITY)   ____________________________________________  EKG   EKG Interpretation  Date/Time:  Wednesday January 07 2021 05:16:39 EST Ventricular Rate:  57 PR Interval:  139 QRS Duration: 84 QT Interval:  430 QTC Calculation: 419 R Axis:   63 Text Interpretation: Sinus rhythm Abnormal R-wave progression, early transition Confirmed by Pryor Curia [phone]) on 01/07/2021 6:17:48 AM        ____________________________________________  RADIOLOGY Jessie Foot Azie Mcconahy, personally viewed and evaluated these images (plain radiographs) as part of my medical  decision making, as well as reviewing the written report by the radiologist.  ED MD interpretation: Chest x-ray shows right-sided mass.  X-ray of the right shoulder shows no acute abnormality.  Official radiology report(s): DG Chest 2 View  Result Date: 01/07/2021 CLINICAL DATA:  65 year old female with history of right-sided chest pain. EXAM: CHEST - 2 VIEW COMPARISON:  Chest x-ray 10/26/2013. FINDINGS: New masslike fullness in the high right paratracheal region causing some deviation of the trachea to the left. Lung volumes are normal. No consolidative airspace disease. No pleural effusions. Mild diffuse interstitial prominence. No evidence of pulmonary edema. Heart size is normal. IMPRESSION: 1. New mass-like fullness in the high right paratracheal region concerning for potential neoplasm or lymphadenopathy. Further evaluation with contrast enhanced chest CT is recommended at this time to better evaluate this finding. Electronically Signed   By: Vinnie Langton M.D.   On:  01/07/2021 06:16   DG Shoulder Right  Result Date: 01/07/2021 CLINICAL DATA:  65 year old female with history of right-sided shoulder pain. EXAM: RIGHT SHOULDER - 2+ VIEW COMPARISON:  Right shoulder radiographs 06/10/2017. FINDINGS: Three views of the right shoulder demonstrate no acute displaced fracture or dislocation. Humeral head is slightly high riding, suggesting underlying rotator cuff pathology. There is joint space narrowing, subchondral sclerosis and osteophyte formation in the glenohumeral and acromioclavicular joints, compatible with osteoarthritis. IMPRESSION: 1. No acute radiographic abnormality of the right shoulder. 2. 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 Electronically Signed   By: Vinnie Langton M.D.   On: 01/07/2021 06:17    ____________________________________________   PROCEDURES  Procedure(s) performed (including Critical Care):  Procedures    ____________________________________________   INITIAL IMPRESSION / ASSESSMENT AND PLAN / ED COURSE  As part of my medical decision making, I reviewed the following data within the [place] History obtained from family, Nursing notes reviewed and incorporated, Labs reviewed , EKG interpreted , Old EKG reviewed, Radiograph reviewed , and Notes from prior ED visits         Patient here with atypical right-sided chest pain.  Seems to be musculoskeletal in nature and associated with right shoulder discomfort and trapezius muscle spasm.  Doubt ACS, PE, dissection but will obtain 1 troponin given her history of obesity and tobacco use.  Pain has been constant for 3 days and I feel if this troponin is negative that she does not need repeat.  EKG is nonischemic.  Will obtain chest x-ray, right shoulder x-ray.  Will give pain medication and reassess.  ED PROGRESS  Patient CT scan patient's  chest x-ray concerning for new masslike fullness in the high right paratracheal region concerning for neoplasm versus lymphadenopathy.  Will obtain CTA of the chest for further evaluation and to also rule out PE.  Patient now tells me that she did have lesions seen on the CT of her head end of November that was ordered by her primary care doctor because she was complaining of intermittent headaches.  On review of these records in care everywhere it appears she had a 3.2 x 3.1 x 3.0 cm isodense lesion with surrounding vasogenic edema and slight midline shift.  She denies that she has been on any steroids.  She states she is supposed to have an MRI as an outpatient.  She denies any known history of cancer previously.  Labs unremarkable.  Troponin negative.  X-ray of the right shoulder shows no acute  abnormality but does show chronic degenerative changes of osteoarthritis and probable chronic rotator cuff disease.  Discussed all findings with patient and daughter at bedside.  They are comfortable with this plan.  Signed out the oncoming ED physician.  I reviewed all nursing notes and pertinent previous records as available.  I have reviewed and interpreted any EKGs, lab and urine results, imaging (as available).  ____________________________________________   FINAL CLINICAL IMPRESSION(S) / ED DIAGNOSES  Final diagnoses:  Right-sided chest pain     ED Discharge Orders     None       *Please note:  LINN CLAVIN was evaluated in Emergency Department on 01/07/2021 for the symptoms described in the history of present illness. She was evaluated in the context of the global COVID-19 pandemic, which necessitated consideration that the patient might be at risk for infection with the SARS-CoV-2 virus that causes COVID-19. Institutional protocols and algorithms that pertain to the evaluation of patients at risk for COVID-19 are in a state of rapid change based on information released by regulatory  bodies including the CDC and federal and state organizations. These policies and algorithms were followed during the patient's care in the ED.  Some ED evaluations and interventions may be delayed as a result of limited staffing during and the pandemic.*   Note:  This document was prepared using Dragon voice recognition software and may include unintentional dictation errors.    Georgie Haque, Delice Bison, DO 01/07/21 [phone]

## 2021-01-07 NOTE — Consult Note (Signed)
[place]  [phone] [phone]  ID: Regina Petty OB: 09-30-1955  MR#: [id number]  CSN#:[id number]  Patient Care Team: Romualdo Bolk, FNP as PCP - General (Nurse Practitioner)  CHIEF COMPLAINT: Thyroid mass and brain mass.  INTERVAL HISTORY: Patient is a 65 year old female who reports chronic shoulder and neck pain over the past several months.  She was getting no relief, therefore decided to be evaluated in the ED secondary to increasing intensity.  She also complains of intermittent headaches.  She has no other neurologic complaints.  She denies any recent fevers or illnesses.  She has a fair appetite, but denies weight loss.  She has no chest pain, shortness of breath, cough, or hemoptysis.  She denies any nausea, vomiting, constipation, or diarrhea.  She has no melena or hematochezia.  She has no urinary complaints.  Patient offers no further specific complaints  REVIEW OF SYSTEMS:   Review of Systems  Constitutional: Negative.  Negative for fever, malaise/fatigue and weight loss.  Respiratory: Negative.  Negative for cough, hemoptysis and shortness of breath.   Cardiovascular: Negative.  Negative for leg swelling.  Gastrointestinal: Negative.  Negative for abdominal pain, blood in stool and melena.  Genitourinary: Negative.  Negative for dysuria.  Musculoskeletal:  Positive for neck pain.  Skin: Negative.  Negative for rash.  Neurological:  Positive for headaches. Negative for speech change, focal weakness and weakness.  Psychiatric/Behavioral: Negative.  The patient is not nervous/anxious.    As per HPI. Otherwise, a complete review of systems is negative.  PAST MEDICAL HISTORY: Past Medical History:  Diagnosis Date   Depressive disorder    GERD (gastroesophageal reflux disease)    Hiatal hernia    Macromastia    Obesity    Post-menopausal bleeding    Sleep apnea     PAST SURGICAL HISTORY: Past Surgical History:   Procedure Laterality Date   BARIATRIC SURGERY     buionectomy     COLON SURGERY     ENDOMETRIAL ABLATION     gastectomy     GASTRIC RESTRICTION SURGERY     LAPAROSCOPIC PARTIAL PROTECTOMY     NASAL SINUS SURGERY     TUBAL LIGATION     UGE      FAMILY HISTORY: Family History  Problem Relation Age of Onset   Heart failure Mother    Stroke Father    Diabetes Father     ADVANCED DIRECTIVES (Y/N):  @ADVDIR @  HEALTH MAINTENANCE: Social History   Tobacco Use   Smoking status: Former    Types: Cigarettes    Quit date: 1995    Years since quitting: 27.9   Smokeless tobacco: Never  Vaping Use   Vaping Use: Never used  Substance Use Topics   Alcohol use: Yes   Drug use: No     Colonoscopy:  PAP:  Bone density:  Lipid panel:  Allergies  Allergen Reactions   Hydrocodone Nausea And Vomiting   Latex Rash   Nickel Rash    Current Facility-Administered Medications  Medication Dose Route Frequency Provider Last Rate Last Admin   0.9 %  sodium chloride infusion  250 mL Intravenous PRN Agbata, Tochukwu, MD       acetaminophen (TYLENOL) tablet 650 mg  650 mg Oral Q6H PRN Agbata, Tochukwu, MD       Or   acetaminophen (TYLENOL) suppository 650 mg  650 mg Rectal Q6H PRN Agbata, Tochukwu, MD       ALPRAZolam (XANAX) tablet 0.5 mg  0.5 mg Oral QHS PRN Agbata, Tochukwu, MD       cholecalciferol (VITAMIN D) tablet 5,000 Units  5,000 Units Oral Daily Agbata, Tochukwu, MD   5,000 Units at 01/07/21 1023   citalopram (CELEXA) tablet 20 mg  20 mg Oral Daily Agbata, Tochukwu, MD   20 mg at 01/07/21 1022   dexamethasone (DECADRON) injection 8 mg  8 mg Intravenous Q8H Agbata, Tochukwu, MD       morphine 2 MG/ML injection 2 mg  2 mg Intravenous Q4H PRN Agbata, Tochukwu, MD   2 mg at 01/07/21 0933   multivitamins with iron tablet 1 tablet  1 tablet Oral Daily Agbata, Tochukwu, MD   1 tablet at 01/07/21 1022   ondansetron (ZOFRAN) tablet 4 mg  4 mg Oral Q6H PRN Agbata, Tochukwu, MD        Or   ondansetron (ZOFRAN) injection 4 mg  4 mg Intravenous Q6H PRN Agbata, Tochukwu, MD       sodium chloride flush (NS) 0.9 % injection 3 mL  3 mL Intravenous Q12H Agbata, Tochukwu, MD   3 mL at 01/07/21 1023   sodium chloride flush (NS) 0.9 % injection 3 mL  3 mL Intravenous PRN Agbata, Tochukwu, MD       Current Outpatient Medications  Medication Sig Dispense Refill   ALPRAZolam (XANAX) 0.5 MG tablet Take 0.5 mg by mouth at bedtime as needed for anxiety.     Aspirin (VAZALORE) 81 MG CAPS Take 1 tablet by mouth daily.     cetirizine (ZYRTEC) 10 MG tablet Take 10 mg by mouth daily.     Cholecalciferol 125 MCG (5000 UT) TABS Take 1 tablet by mouth in the morning.     citalopram (CELEXA) 20 MG tablet Take 20 mg by mouth daily.     fluticasone (FLONASE) 50 MCG/ACT nasal spray Place into both nostrils daily.     ibuprofen (ADVIL) 200 MG tablet Take 800 mg by mouth every 6 (six) hours as needed.     Multiple Vitamins-Iron (DAILY-VITE/IRON/BETA-CAROTENE) TABS Take 1 tablet by mouth daily.     naproxen (NAPROSYN) 500 MG tablet Take 1 tablet by mouth daily.     amoxicillin (AMOXIL) 875 MG tablet Take 1 tablet (875 mg total) by mouth 2 (two) times daily. (Patient not taking: Reported on 01/07/2021) 20 tablet 0   oxyCODONE-acetaminophen (PERCOCET/ROXICET) 5-325 MG tablet 1-2 tabs po bid prn (Patient not taking: Reported on 01/07/2021) 8 tablet 0    OBJECTIVE: Vitals:   01/07/21 1311 01/07/21 1400  BP: 134/84 (!) 144/90  Pulse: 70 70  Resp: 13 14  Temp:    SpO2: 94% 92%     Body mass index is 40.51 kg/m.    ECOG FS:1 - Symptomatic but completely ambulatory  General: Well-developed, well-nourished, no acute distress. Eyes: Pink conjunctiva, anicteric sclera. HEENT: Normocephalic, moist mucous membranes.  Right neck mass noted on CT scan minimally palpable. Lungs: No audible wheezing or coughing. Heart: Regular rate and rhythm. Abdomen: Soft, nontender, no obvious distention. Musculoskeletal:  No edema, cyanosis, or clubbing. Neuro: Alert, answering all questions appropriately. Cranial nerves grossly intact. Skin: No rashes or petechiae noted. Psych: Normal affect. Lymphatics: No cervical, calvicular, axillary or inguinal LAD.   LAB RESULTS:  Lab Results  Component Value Date   NA 137 01/07/2021   K 4.5 01/07/2021   CL 107 01/07/2021   CO2 24 01/07/2021   GLUCOSE 87 01/07/2021   BUN 21 01/07/2021   CREATININE 0.83 01/07/2021  CALCIUM 8.6 (L) 01/07/2021   GFRNONAA >60 01/07/2021    Lab Results  Component Value Date   WBC 7.6 01/07/2021   NEUTROABS 2.8 01/07/2021   HGB 12.9 01/07/2021   HCT 41.7 01/07/2021   MCV 99.0 01/07/2021   PLT 261 01/07/2021     STUDIES: DG Chest 2 View  Result Date: 01/07/2021 CLINICAL DATA:  65 year old female with history of right-sided chest pain. EXAM: CHEST - 2 VIEW COMPARISON:  Chest x-ray 10/26/2013. FINDINGS: New masslike fullness in the high right paratracheal region causing some deviation of the trachea to the left. Lung volumes are normal. No consolidative airspace disease. No pleural effusions. Mild diffuse interstitial prominence. No evidence of pulmonary edema. Heart size is normal. IMPRESSION: 1. New mass-like fullness in the high right paratracheal region concerning for potential neoplasm or lymphadenopathy. Further evaluation with contrast enhanced chest CT is recommended at this time to better evaluate this finding. Electronically Signed   By: Vinnie Langton M.D.   On: 01/07/2021 06:16   DG Shoulder Right  Result Date: 01/07/2021 CLINICAL DATA:  65 year old female with history of right-sided shoulder pain. EXAM: RIGHT SHOULDER - 2+ VIEW COMPARISON:  Right shoulder radiographs 06/10/2017. FINDINGS: Three views of the right shoulder demonstrate no acute displaced fracture or dislocation. Humeral head is slightly high riding, suggesting underlying rotator cuff pathology. There is joint space narrowing, subchondral sclerosis  and osteophyte formation in the glenohumeral and acromioclavicular joints, compatible with osteoarthritis. IMPRESSION: 1. No acute radiographic abnormality of the right shoulder. 2. 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 Electronically Signed   By: Vinnie Langton M.D.   On: 01/07/2021 06:17   CT Angio Chest PE W and/or Wo Contrast  Result Date: 01/07/2021 CLINICAL DATA:  65 year old female with history of chest pain. Possible mass noted on chest x-ray. Follow-up study. EXAM: CT ANGIOGRAPHY CHEST WITH CONTRAST TECHNIQUE: Multidetector CT imaging of the chest was performed using the standard protocol during bolus administration of intravenous contrast. Multiplanar CT image reconstructions and MIPs were obtained to evaluate the vascular anatomy. CONTRAST:  45mL OMNIPAQUE IOHEXOL 350 MG/ML SOLN COMPARISON:  No priors.  Chest x-ray 01/07/2021. FINDINGS: Cardiovascular: No filling defects are noted within the pulmonary arterial tree to suggest pulmonary embolism. Heart size is normal. There is no significant pericardial fluid, thickening or pericardial calcification. No atherosclerotic calcifications are noted in the thoracic aorta or the coronary arteries. Mediastinum/Nodes: The previously described high right paratracheal mass corresponds to asymmetric mass-like enlargement of the right lobe of the thyroid gland which is incompletely imaged on today's study, but estimated to measure at least 5.7 x 3.3 x 6.9 cm. Enlarged subcarinal lymph node also noted measuring 1.7 cm in short axis. Esophagus is unremarkable in appearance. No axillary lymphadenopathy. Lungs/Pleura: Status post sleeve gastrectomy. Upper Abdomen: There are no aggressive appearing lytic or blastic lesions noted in the visualized portions of the skeleton. Musculoskeletal: There are no aggressive appearing lytic or blastic lesions  noted in the visualized portions of the skeleton. Review of the MIP images confirms the above findings. IMPRESSION: 1. No evidence of pulmonary embolism. 2. Asymmetric mass-like enlargement of the right lobe of the thyroid gland which has clearly progressed compared to prior examinations based on old chest radiographs. This finding is concerning for potential primary thyroid neoplasm. Further clinical evaluation is recommended. 3. Mildly enlarged subcarinal lymph node. The possibility of metastatic disease should be considered. Electronically Signed  By: Vinnie Langton M.D.   On: 01/07/2021 07:40   MR Brain W and Wo Contrast  Result Date: 01/07/2021 CLINICAL DATA:  Brain metastases suspected. Additional history provided: Intermittent frontal headaches over the last 4 months. Outpatient CT showing isodense 3.2 cm mass with surrounding vasogenic edema. EXAM: MRI HEAD WITHOUT AND WITH CONTRAST TECHNIQUE: Multiplanar, multiecho pulse sequences of the brain and surrounding structures were obtained without and with intravenous contrast. CONTRAST:  48mL GADAVIST GADOBUTROL 1 MMOL/ML IV SOLN COMPARISON:  No pertinent prior exams available for comparison. FINDINGS: Brain: Cerebral volume appears normal for age. There is a 3.8 x 4.1 x 3.7 cm extra-axial, dural-based mass centered along the mid-falx. The mass demonstrates somewhat heterogeneous enhancement. A greater volume of tumor is present to the left of the falx than to the right. There is suspected invasion of the adjacent superior sagittal sinus. Mass effect on the posterior frontal lobes (left greater than right). Edema within the underlying frontal lobes, moderate on the left and mild on the right. Partial effacement of the lateral ventricles. 2 mm rightward midline shift measured at the level the septum pellucidum. Prominent perivascular space versus chronic lacunar infarct within the anterior limb of left internal capsule (series 16, image 12). Partially  empty sella turcica. No cortical encephalomalacia is identified. There is no acute infarct. No chronic intracranial blood products are identified, although susceptibility artifact along the left frontoparietal vertex limits evaluation. No extra-axial fluid collection. Vascular: Maintained flow voids within the proximal large arterial vessels. 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 Skull and upper cervical spine: No focal suspicious marrow lesion. Sinuses/Orbits: Visualized orbits show no acute finding. No significant paranasal sinus disease. IMPRESSION: 4.1 x 3.8 cm extra-axial dural-based mass centered along the mid-falx, as described and favored to reflect a meningioma. 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 Mass effect upon the posterior frontal lobes, left greater than right. Additionally, there is edema within the underlying frontal lobes, moderate on the left and mild on the right. Partial effacement of the lateral ventricles. 2 mm rightward midline shift measured at the level of the septum pellucidum. Prominent perivascular space versus chronic lacunar infarct within the anterior limb of left internal capsule. Electronically Signed   By: Kellie Simmering D.O.   On: 01/07/2021 12:20    ASSESSMENT: Thyroid mass and brain mass.  PLAN:    Thyroid mass: CT scan results from 08/07/2020 reviewed independently reported as above revealing a 7 cm mass which appears to be evolving from the right lobe of the thyroid.  No noted lymphadenopathy was reported.  Suspicious for malignancy.  Patient will require dedicated neck imaging, imaging of her abdomen and pelvis as well as ultrasound-guided biopsy of mass to determine whether this is malignant or not.  Brain mass: MRI from January 07, 2021 reviewed independently and report as above with a 4.1 midline dural based mass with suspected invasion into the adjacent superior sagittal sinus.  There is mass-effect noted  on posterior frontal lobes with edema.  Likely meningioma.  Case discussed with neurosurgery who will evaluate later today.  Unlikely related to underlying thyroid mass.   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  Patient has been initiated on IV Decadron 8 mg 3 times daily.  Appreciate consult, will follow.  Lloyd Huger, MD   01/07/2021 3:10 PM

## 2021-01-08 ENCOUNTER — Other Ambulatory Visit: Payer: Self-pay

## 2021-01-08 ENCOUNTER — Inpatient Hospital Stay: Payer: BLUE CROSS/BLUE SHIELD

## 2021-01-08 ENCOUNTER — Encounter: Payer: Self-pay | Admitting: Internal Medicine

## 2021-01-08 LAB — BASIC METABOLIC PANEL
Anion gap: 8 (ref 5–15)
BUN: 21 mg/dL (ref 8–23)
CO2: 25 mmol/L (ref 22–32)
Calcium: 9.5 mg/dL (ref 8.9–10.3)
Chloride: 102 mmol/L (ref 98–111)
Creatinine, Ser: 0.91 mg/dL (ref 0.44–1.00)
GFR, Estimated: >60 mL/min (ref >60)
Glucose, Bld: 144 mg/dL — ABNORMAL HIGH (ref 70–99)
Potassium: 4.3 mmol/L (ref 3.5–5.1)
Sodium: 135 mmol/L (ref 135–145)

## 2021-01-08 LAB — HEPATIC FUNCTION PANEL
ALT: 13 U/L (ref 0–44)
AST: 20 U/L (ref 15–41)
Albumin: 3.5 g/dL (ref 3.5–5.0)
Alkaline Phosphatase: 65 U/L (ref 38–126)
Bilirubin, Direct: <0.1 mg/dL (ref 0.0–0.2)
Total Bilirubin: 0.6 mg/dL (ref 0.3–1.2)
Total Protein: 7.2 g/dL (ref 6.5–8.1)

## 2021-01-08 LAB — CBC
HCT: 41.3 % (ref 36.0–46.0)
Hemoglobin: 13.5 g/dL (ref 12.0–15.0)
MCH: 29.7 pg (ref 26.0–34.0)
MCHC: 32.7 g/dL (ref 30.0–36.0)
MCV: 91 fL (ref 80.0–100.0)
Platelets: 300 10*3/uL (ref 150–400)
RBC: 4.54 MIL/uL (ref 3.87–5.11)
RDW: 11.8 % (ref 11.5–15.5)
WBC: 9 10*3/uL (ref 4.0–10.5)
nRBC: 0 % (ref 0.0–0.2)

## 2021-01-08 LAB — RESP PANEL BY RT-PCR (FLU A&B, COVID) ARPGX2
Influenza A by PCR: NEGATIVE
Influenza B by PCR: NEGATIVE
SARS Coronavirus 2 by RT PCR: POSITIVE — AB

## 2021-01-08 IMAGING — CT CT NECK W/ CM
5 series · 16 of 35 positions shown, 18 images · IV contrast (omnipaque)
Comparison: Correlation made with CT chest [DATE]

CLINICAL DATA: Neck mass, nonpulsatile thyroid mass

EXAM:
CT NECK WITH CONTRAST
TECHNIQUE: Multidetector CT imaging of the neck was performed using the
standard protocol following the bolus administration of intravenous
contrast.
CONTRAST:  75mL OMNIPAQUE IOHEXOL 300 MG/ML  SOLN

[Series 2: axial neck · axial · 0.55mm/px · z∈[-47,+69]mm · 3 of 116 slices shown]
[im 29/116  bone]
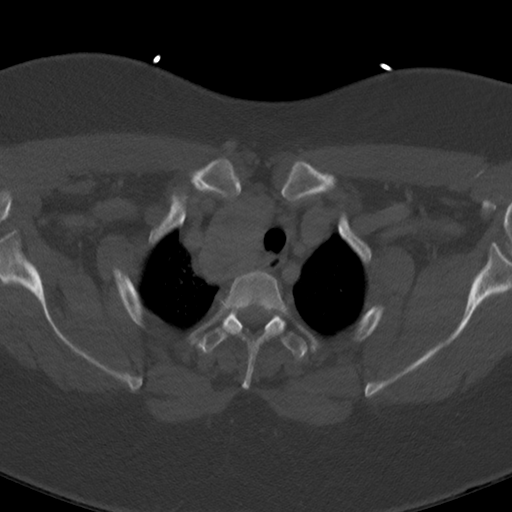
[im 58/116  bone]
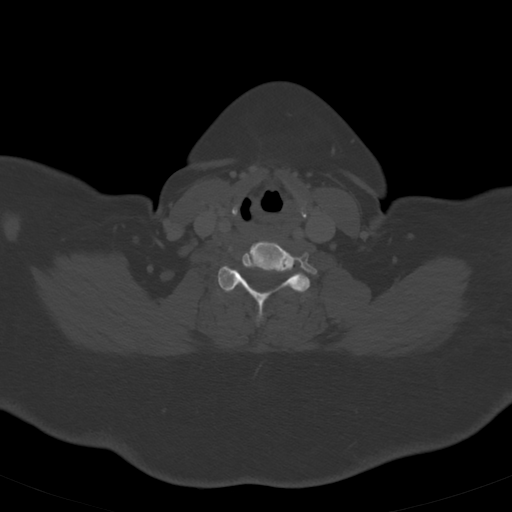
[im 87/116  bone]
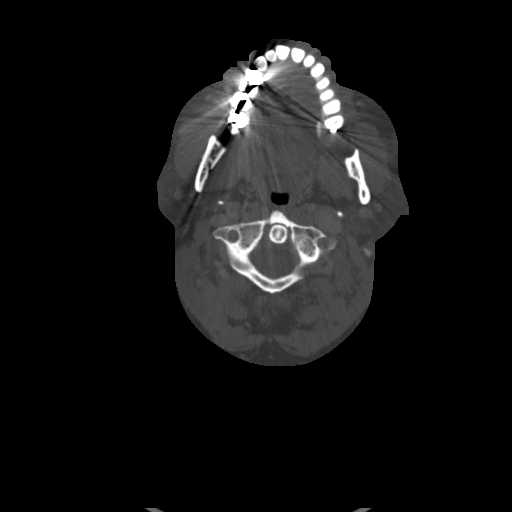

[Series 4: axial bone · axial · 0.55mm/px · z∈[-27,+49]mm · 2 of 116 slices shown]
[im 39/116  bone]
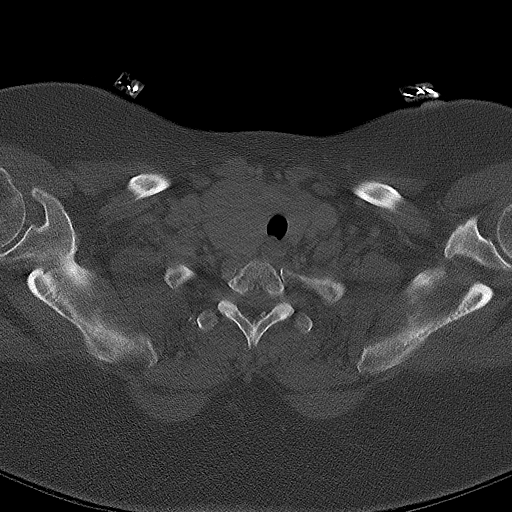
[im 77/116  bone]
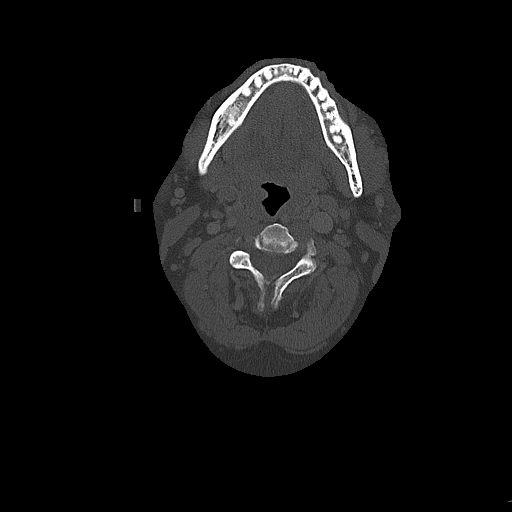

[Series 5: sag neck · sagittal · 0.50mm/px · 5 of 133 slices shown, 6 images]
[im 45/133  bone]
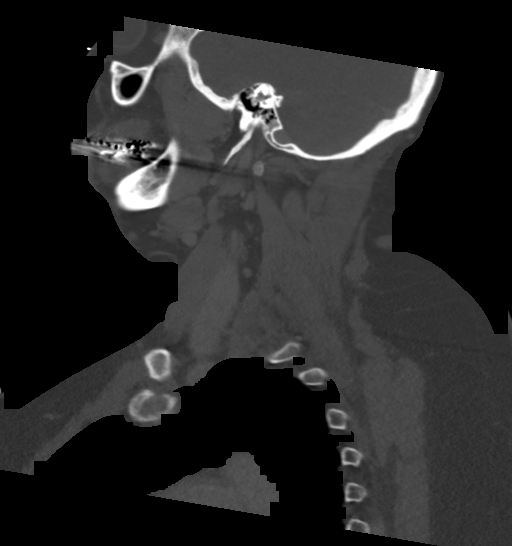
[im 56/133  bone]
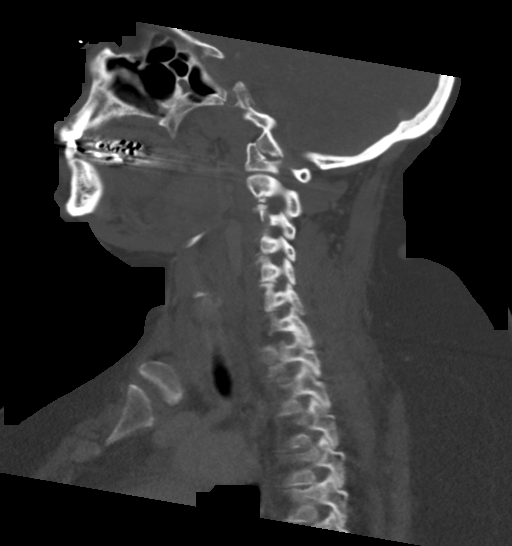
[im 67/133  soft-tissue]
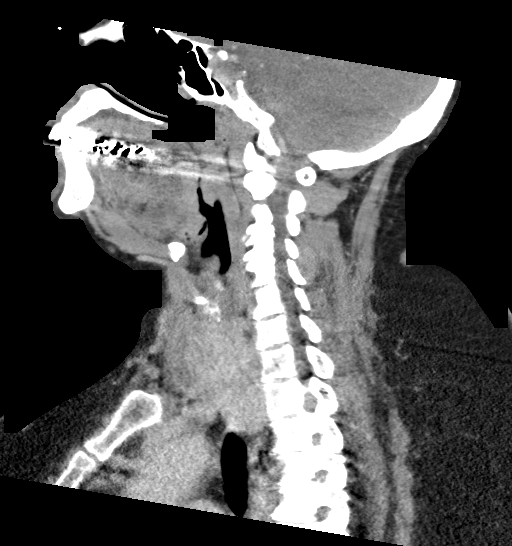
[im 67/133  bone]
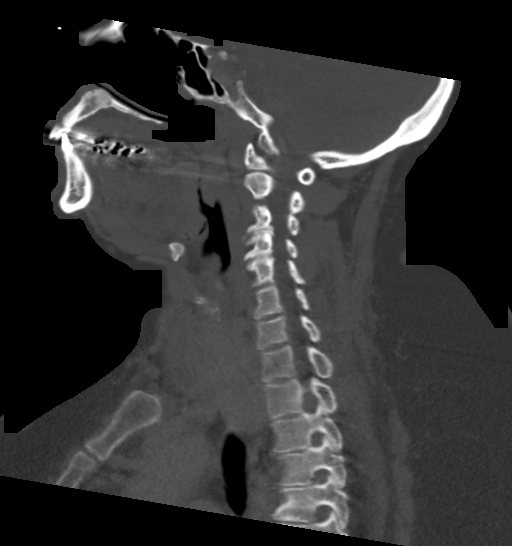
[im 78/133  bone]
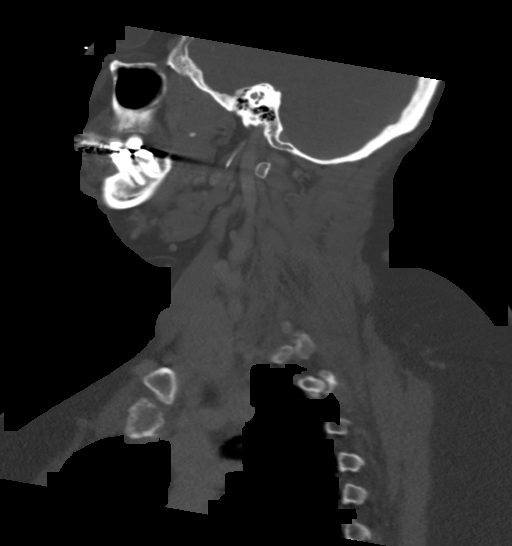
[im 89/133  bone]
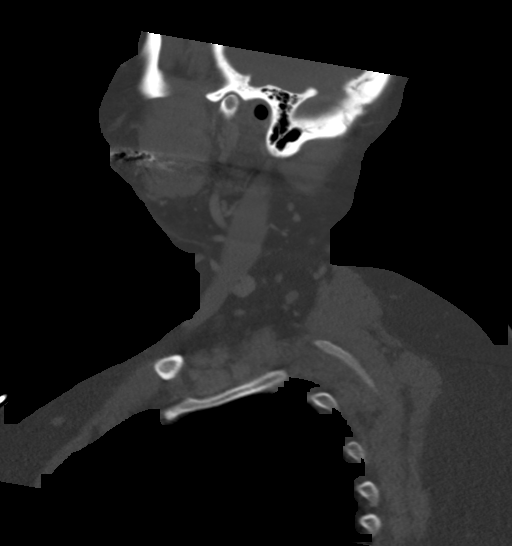

[Series 6: cor neck · coronal · 0.52mm/px · 3 of 128 slices shown]
[im 26/128  bone]
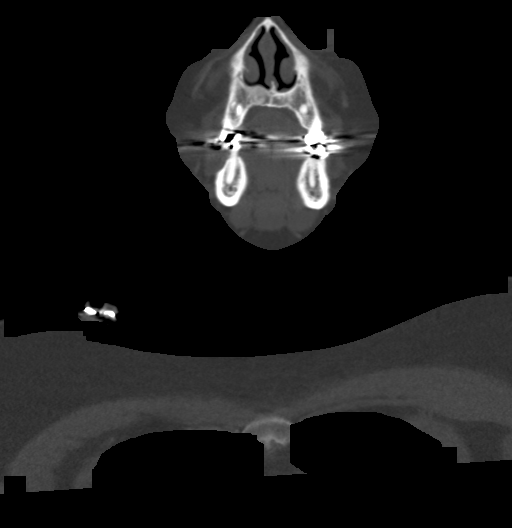
[im 51/128  bone]
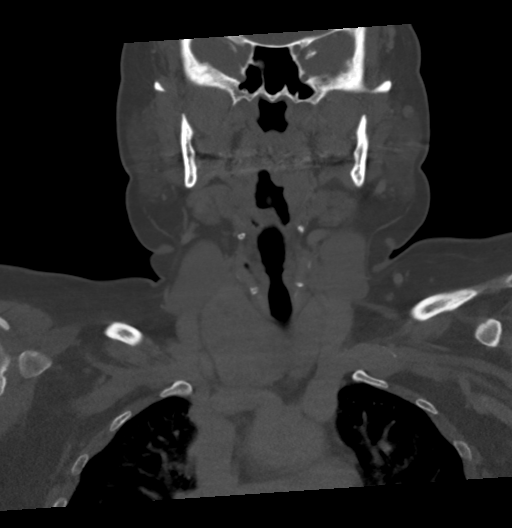
[im 77/128  bone]
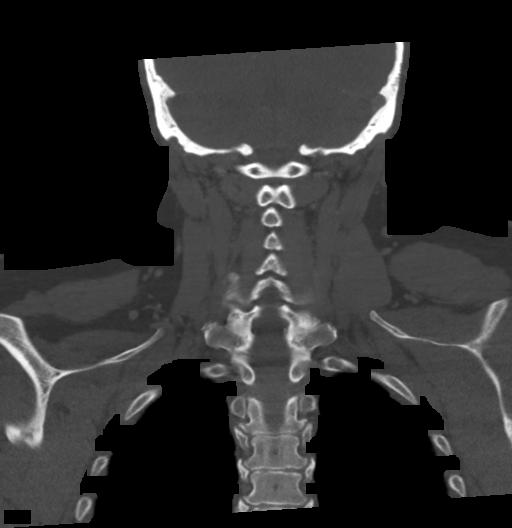

[Series 7: orthogonal (person_name) · axial · 0.50mm/px · z∈[-64,+70]mm · 3 of 137 slices shown, 4 images]
[im 35/137  soft-tissue]
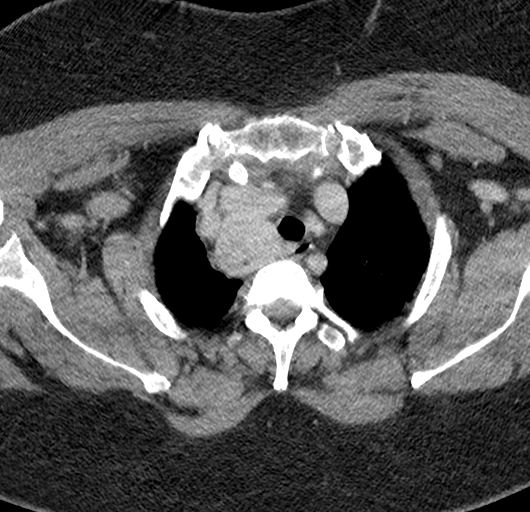
[im 35/137  bone]
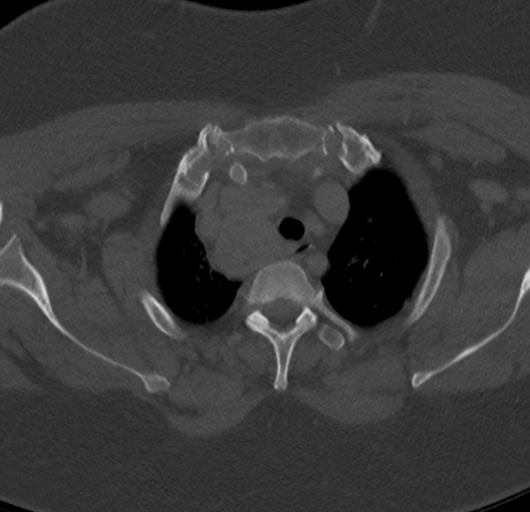
[im 69/137  bone]
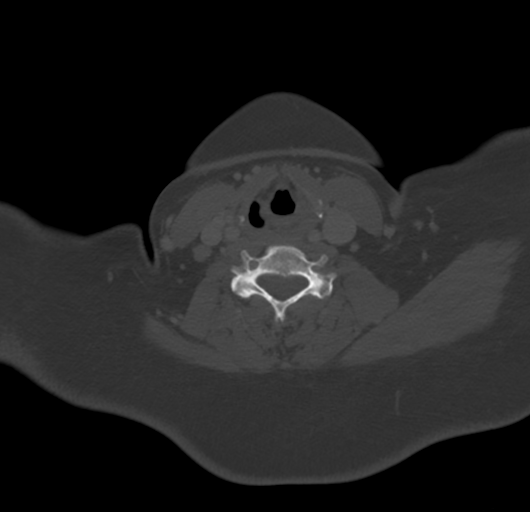
[im 103/137  bone]
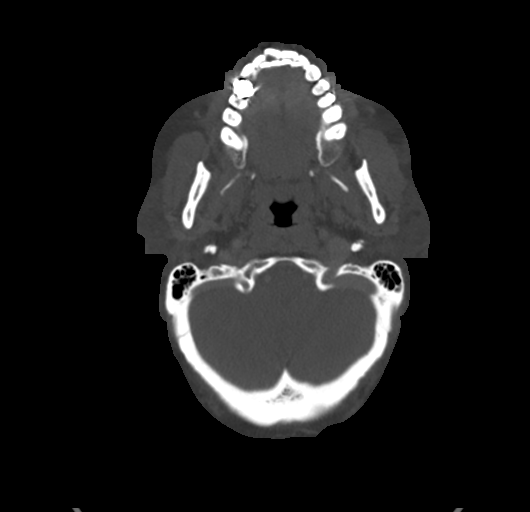

[16 of 35 positions shown; findings below may reference images not displayed]

FINDINGS: Pharynx and larynx: Unremarkable.  No mass or swelling.

Salivary glands: Parotid and submandibular glands are unremarkable.

Thyroid: Enlarged and heterogeneous right thyroid with substernal
extension. There is leftward displacement of the trachea with mild
flattening in the transverse dimension.

Lymph nodes: No enlarged or abnormal density nodes.

Vascular: Major neck vessels are patent. Retropharyngeal course of
the proximal ICA on the left.

Limited intracranial: No abnormal enhancement.

Visualized orbits: Unremarkable.

Mastoids and visualized paranasal sinuses: Evidence of prior
sinonasal surgery. Mastoid air cells are clear.

Skeleton: Upper cervical spine degenerative changes.

Upper chest: No new abnormality.

Other: None.
IMPRESSION: Enlarged heterogeneous right thyroid with substernal extension.
Leftward displacement of trachea with mild flattening.

No enlarged nodes.

Recommend ultrasound evaluation. Necessity of tissue sampling can be
determined at that time.

## 2021-01-08 MED ORDER — IOHEXOL 300 MG/ML  SOLN
75.0000 mL | Freq: Once | INTRAMUSCULAR | Status: AC | PRN
  Administered 2021-01-08: 75 mL via INTRAVENOUS
  Filled 2021-01-08: qty 75

## 2021-01-08 MED ORDER — SODIUM CHLORIDE 0.9 % IV SOLN
100.0000 mg | Freq: Every day | INTRAVENOUS | Status: DC
  Administered 2021-01-09 – 2021-01-10 (×2): 100 mg via INTRAVENOUS
  Filled 2021-01-08: qty 20
  Filled 2021-01-08: qty 100

## 2021-01-08 MED ORDER — SODIUM CHLORIDE 0.9 % IV SOLN
200.0000 mg | Freq: Once | INTRAVENOUS | Status: AC
  Administered 2021-01-08: 18:00:00 200 mg via INTRAVENOUS
  Filled 2021-01-08: qty 200

## 2021-01-08 NOTE — Progress Notes (Signed)
PROGRESS NOTE    Regina Petty  KGM:[id number] DOB: 1955-03-29 DOA: 01/07/2021 PCP: Romualdo Bolk, FNP    Brief Narrative:  Regina Petty is a 65 y.o. female with medical history significant for obesity with complications of sleep apnea, GERD with hiatal hernia who presents to the ER for evaluation of pain in the right side of her neck, right shoulder and arm which she rated a 10 x 10 in intensity at its worst.  She states that she has had pain for about 4 days and decided to present to the ER for further evaluation due to worsening and increased intensity of the pain    Consultants:  Neurosurgery, oncology  Procedures:   Antimicrobials:      Subjective: Pt denies sob, dizziness  Objective: Vitals:   01/08/21 0507 01/08/21 1000 01/08/21 1438 01/08/21 1624  BP: 139/84 122/61 118/68 (!) 154/75  Pulse: 72 97 78 86  Resp: 16 13 18 19   Temp:    98 F (36.7 C)  TempSrc:   Oral Oral  SpO2: 92% 97% 95% 93%  Weight:      Height:       No intake or output data in the 24 hours ending 01/08/21 1704 Filed Weights   01/07/21 0504  Weight: 107 kg    Examination:  General exam: Appears calm and comfortable  Respiratory system: Clear to auscultation. Respiratory effort normal. Cardiovascular system: S1 & S2 heard, RRR. No JVD, murmurs, rubs, gallops or clicks.  Gastrointestinal system: Abdomen is nondistended, soft and nontender. Normal bowel sounds heard. Central nervous system: Alert and oriented. No focal neurological deficits. Extremities: no edema Psychiatry: Judgement and insight appear normal. Mood & affect appropriate.     Data Reviewed: I have personally reviewed following labs and imaging studies  CBC: Recent Labs  Lab 01/07/21 0517 01/08/21 0618  WBC 7.6 9.0  NEUTROABS 2.8  --   HGB 12.9 13.5  HCT 41.7 41.3  MCV 99.0 91.0  PLT 261 644   Basic Metabolic Panel: Recent Labs  Lab 01/07/21 0517 01/08/21 0618  NA 137 135  K 4.5 4.3  CL 107 102   CO2 24 25  GLUCOSE 87 144*  BUN 21 21  CREATININE 0.83 0.91  CALCIUM 8.6* 9.5   GFR: Estimated Creatinine Clearance: 75.5 mL/min (by C-G formula based on SCr of 0.91 mg/dL). Liver Function Tests: Recent Labs  Lab 01/08/21 0618  AST 20  ALT 13  ALKPHOS 65  BILITOT 0.6  PROT 7.2  ALBUMIN 3.5   No results for input(s): LIPASE, AMYLASE in the last 168 hours. No results for input(s): AMMONIA in the last 168 hours. Coagulation Profile: No results for input(s): INR, PROTIME in the last 168 hours. Cardiac Enzymes: No results for input(s): CKTOTAL, CKMB, CKMBINDEX, TROPONINI in the last 168 hours. BNP (last 3 results) No results for input(s): PROBNP in the last 8760 hours. HbA1C: No results for input(s): HGBA1C in the last 72 hours. CBG: No results for input(s): GLUCAP in the last 168 hours. Lipid Profile: No results for input(s): CHOL, HDL, LDLCALC, TRIG, CHOLHDL, LDLDIRECT in the last 72 hours. Thyroid Function Tests: No results for input(s): TSH, T4TOTAL, FREET4, T3FREE, THYROIDAB in the last 72 hours. Anemia Panel: No results for input(s): VITAMINB12, FOLATE, FERRITIN, TIBC, IRON, RETICCTPCT in the last 72 hours. Sepsis Labs: No results for input(s): PROCALCITON, LATICACIDVEN in the last 168 hours.  Recent Results (from the past 240 hour(s))  Resp Panel by RT-PCR (Flu A&B, Covid)  Nasopharyngeal Swab     Status: Abnormal   Collection Time: 01/07/21  8:58 AM   Specimen: Nasopharyngeal Swab; Nasopharyngeal(NP) swabs in vial transport medium  Result Value Ref Range Status   SARS Coronavirus 2 by RT PCR POSITIVE (A) NEGATIVE Final    Comment: RESULT CALLED TO, READ BACK BY AND VERIFIED WITH: LINDSAY BLACK@1050  01/07/21 KLW    Influenza A by PCR NEGATIVE NEGATIVE Final   Influenza B by PCR NEGATIVE NEGATIVE Final    Comment: Performed at [facility], [address], [place], [place] [address]         Radiology Studies: DG Chest 2 View  Result Date:  01/07/2021 CLINICAL DATA:  65 year old female with history of right-sided chest pain. EXAM: CHEST - 2 VIEW COMPARISON:  Chest x-ray 10/26/2013. FINDINGS: New masslike fullness in the high right paratracheal region causing some deviation of the trachea to the left. Lung volumes are normal. No consolidative airspace disease. No pleural effusions. Mild diffuse interstitial prominence. No evidence of pulmonary edema. Heart size is normal. IMPRESSION: 1. New mass-like fullness in the high right paratracheal region concerning for potential neoplasm or lymphadenopathy. Further evaluation with contrast enhanced chest CT is recommended at this time to better evaluate this finding. Electronically Signed   By: Vinnie Langton M.D.   On: 01/07/2021 06:16   DG Shoulder Right  Result Date: 01/07/2021 CLINICAL DATA:  65 year old female with history of right-sided shoulder pain. EXAM: RIGHT SHOULDER - 2+ VIEW COMPARISON:  Right shoulder radiographs 06/10/2017. FINDINGS: Three views of the right shoulder demonstrate no acute displaced fracture or dislocation. Humeral head is slightly high riding, suggesting underlying rotator cuff pathology. There is joint space narrowing, subchondral sclerosis and osteophyte formation in the glenohumeral and acromioclavicular joints, compatible with osteoarthritis. IMPRESSION: 1. No acute radiographic abnormality of the right shoulder. 2. 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 Electronically Signed   By: Vinnie Langton M.D.   On: 01/07/2021 06:17   CT SOFT TISSUE NECK W CONTRAST  Result Date: 01/08/2021 CLINICAL DATA:  Neck mass, nonpulsatile thyroid mass EXAM: CT NECK WITH CONTRAST TECHNIQUE: Multidetector CT imaging of the neck was performed using the standard protocol following the bolus administration of intravenous contrast. CONTRAST:  80mL OMNIPAQUE IOHEXOL 300  MG/ML  SOLN COMPARISON:  Correlation made with CT chest 01/07/2021 FINDINGS: Pharynx and larynx: Unremarkable.  No mass or swelling. Salivary glands: Parotid and submandibular glands are unremarkable. 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 There is leftward displacement of the trachea with mild flattening in the transverse dimension. Lymph nodes: No enlarged or abnormal density nodes. Vascular: Major neck vessels are patent. Retropharyngeal course of the proximal ICA on the left. Limited intracranial: No abnormal enhancement. Visualized orbits: Unremarkable. Mastoids and visualized paranasal sinuses: Evidence of prior sinonasal surgery. Mastoid air cells are clear. Skeleton: Upper cervical spine degenerative changes. Upper chest: No new abnormality. Other: None. 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 Leftward displacement of trachea with mild flattening. No enlarged nodes. Recommend ultrasound evaluation. Necessity of tissue sampling can be determined at that time. Electronically Signed   By: Macy Mis M.D.   On: 01/08/2021 12:21   CT Angio Chest PE W and/or Wo Contrast  Result Date: 01/07/2021 CLINICAL DATA:  65 year old female with history of chest pain. Possible mass noted on chest x-ray. Follow-up study. EXAM: CT ANGIOGRAPHY CHEST WITH CONTRAST TECHNIQUE: Multidetector CT imaging  of the chest was performed using the standard protocol during bolus administration of intravenous contrast. Multiplanar CT image reconstructions and MIPs were obtained to evaluate the vascular anatomy. CONTRAST:  61mL OMNIPAQUE IOHEXOL 350 MG/ML SOLN COMPARISON:  No priors.  Chest x-ray 01/07/2021. FINDINGS: Cardiovascular: No filling defects are noted within the pulmonary arterial tree to suggest pulmonary embolism. Heart size is normal. There is no significant pericardial fluid, thickening or pericardial calcification. No atherosclerotic calcifications  are noted in the thoracic aorta or the coronary arteries. Mediastinum/Nodes: The previously described high right paratracheal mass corresponds to asymmetric mass-like enlargement of the right lobe of the thyroid gland which is incompletely imaged on today's study, but estimated to measure at least 5.7 x 3.3 x 6.9 cm. Enlarged subcarinal lymph node also noted measuring 1.7 cm in short axis. Esophagus is unremarkable in appearance. No axillary lymphadenopathy. Lungs/Pleura: Status post sleeve gastrectomy. Upper Abdomen: There are no aggressive appearing lytic or blastic lesions noted in the visualized portions of the skeleton. Musculoskeletal: There are no aggressive appearing lytic or blastic lesions noted in the visualized portions of the skeleton. Review of the MIP images confirms the above findings. IMPRESSION: 1. No evidence of pulmonary embolism. 2. Asymmetric mass-like enlargement of the right lobe of the thyroid gland which has clearly progressed compared to prior examinations based on old chest radiographs. This finding is concerning for potential primary thyroid neoplasm. Further clinical evaluation is recommended. 3. Mildly enlarged subcarinal lymph node. The possibility of metastatic disease should be considered. Electronically Signed   By: Vinnie Langton M.D.   On: 01/07/2021 07:40   MR Brain W and Wo Contrast  Result Date: 01/07/2021 CLINICAL DATA:  Brain metastases suspected. Additional history provided: Intermittent frontal headaches over the last 4 months. Outpatient CT showing isodense 3.2 cm mass with surrounding vasogenic edema. EXAM: MRI HEAD WITHOUT AND WITH CONTRAST TECHNIQUE: Multiplanar, multiecho pulse sequences of the brain and surrounding structures were obtained without and with intravenous contrast. CONTRAST:  24mL GADAVIST GADOBUTROL 1 MMOL/ML IV SOLN COMPARISON:  No pertinent prior exams available for comparison. FINDINGS: Brain: Cerebral volume appears normal for age. There is a  3.8 x 4.1 x 3.7 cm extra-axial, dural-based mass centered along the mid-falx. The mass demonstrates somewhat heterogeneous enhancement. A greater volume of tumor is present to the left of the falx than to the right. There is suspected invasion of the adjacent superior sagittal sinus. Mass effect on the posterior frontal lobes (left greater than right). Edema within the underlying frontal lobes, moderate on the left and mild on the right. Partial effacement of the lateral ventricles. 2 mm rightward midline shift measured at the level the septum pellucidum. Prominent perivascular space versus chronic lacunar infarct within the anterior limb of left internal capsule (series 16, image 12). Partially empty sella turcica. No cortical encephalomalacia is identified. There is no acute infarct. No chronic intracranial blood products are identified, although susceptibility artifact along the left frontoparietal vertex limits evaluation. No extra-axial fluid collection. Vascular: Maintained flow voids within the proximal large arterial vessels. 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 Skull and upper cervical spine: No focal suspicious marrow lesion. Sinuses/Orbits: Visualized orbits show no acute finding. No significant paranasal sinus disease. IMPRESSION: 4.1 x 3.8 cm extra-axial dural-based mass centered along the mid-falx, as described and favored to reflect a meningioma. 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 Mass effect upon the posterior frontal lobes, left greater than right. Additionally, there is edema within the underlying  frontal lobes, moderate on the left and mild on the right. Partial effacement of the lateral ventricles. 2 mm rightward midline shift measured at the level of the septum pellucidum. Prominent perivascular space versus chronic lacunar infarct within the anterior limb of left internal capsule. Electronically Signed   By: Kellie Simmering D.O.    On: 01/07/2021 12:20   MR MRV HEAD WO CM  Result Date: 01/07/2021 CLINICAL DATA:  Dural venous sinus thrombosis suspected EXAM: MR VENOGRAM HEAD WITHOUT CONTRAST TECHNIQUE: Angiographic images of the intracranial venous structures were acquired using MRV technique without intravenous contrast. COMPARISON:  No prior MRV, correlation is made with same day MRI head with and without contrast FINDINGS: No signal is seen within the superior sagittal sinus in the location of the dural based mass seen on the same-day MRI head (series 10, image 22), likely reflecting invasion of the superior sagittal sinus. The superior sagittal sinus is otherwise patent There is no other evidence of dural venous sinus or deep cerebral vein thrombosis. Focal narrowing in the right proximal transverse sinus is felt to be artifactual, given contrast in this location on the same-day MRI. IMPRESSION: 1. 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 2. No other evidence of dural venous sinus thrombosis or stenosis. Electronically Signed   By: Merilyn Baba M.D.   On: 01/07/2021 19:55        Scheduled Meds:  cholecalciferol  5,000 Units Oral Daily   citalopram  20 mg Oral Daily   dexamethasone (DECADRON) injection  8 mg Intravenous Q8H   multivitamins with iron  1 tablet Oral Daily   sodium chloride flush  3 mL Intravenous Q12H   Continuous Infusions:  sodium chloride     remdesivir 200 mg in sodium chloride 0.9% 250 mL IVPB     Followed by   Derrill Memo ON 01/09/2021] remdesivir 100 mg in NS 100 mL      Assessment & Plan:   Principal Problem:   Neck mass Active Problems:   Depressive disorder   GERD (gastroesophageal reflux disease)   Obesity, Class III, BMI 40-49.9 (morbid obesity) (HCC)   Vasogenic brain edema [facility])   Patient is a 65 year old female who presents for evaluation of pain in her  right shoulder and the right side of her neck and is found to have a thyroid mass concerning for thyroid neoplasm.     Thyroid mass Concerning for primary thyroid neoplasm  CT neck obtained.  Will need to do thyroid US , then bx. Oncology consulted, input was appreicated    Brain mass-  Neurosurgery consulted, input appreciated, likely meningioma.  Started on decadron iv due to evidence of vasogenic edema MRV for further evaluation  Plan for outpatient f/u with Dr. Lacinda Axon for further discussion about tx of cranial mass Per neurosurgery on dc  to po dexamethasone 2mg  bid x2 weeks and they will take over.            Morbid obesity (BMI 16.1 ) With complications of obstructive sleep apnea Complicates overall px and care        Anxiety and depression Continue celexa  Covid + On RA Asx With her comorbidities we will start her on IV remdesivir.  Discussed with patient and daughter possible side effects of remdesivir.  She was agreeable.  LFTs are normal.     DVT prophylaxis: SCD Code Status: Full Family  Communication: Daughter updated extensively  Disposition Plan:  Status is: Inpatient  Remains inpatient appropriate because: Requiring IV treatment.  Work-up pending            LOS: 1 day   Time spent: 45 minutes with more than 50% on [place], MD [facility] Pager [phone]  If 7PM-7AM, please contact night-coverage 01/08/2021, 5:04 PM

## 2021-01-08 NOTE — ED Notes (Signed)
Informed RN bed assigned 

## 2021-01-08 NOTE — Consult Note (Signed)
Remdesivir - Pharmacy Brief Note   O:  ALT: 13 CXR: concerning for potential neoplasm SpO2: 97% on RA   A/P:  Incidental COVID +. None/minimal respiratory symptoms  Remdesivir 200 mg IVPB once followed by 100 mg IVPB daily x 4 days.  Will re-evaluate for possible 3 day regimen   Dorothe Pea, PharmD, BCPS Clinical Pharmacist   01/08/2021 2:10 PM

## 2021-01-09 LAB — GLUCOSE, CAPILLARY
Glucose-Capillary: 156 mg/dL — ABNORMAL HIGH (ref 70–99)
Glucose-Capillary: 94 mg/dL (ref 70–99)
Glucose-Capillary: 129 mg/dL — ABNORMAL HIGH (ref 70–99)

## 2021-01-09 NOTE — Progress Notes (Signed)
PROGRESS NOTE    ELAINE ROANHORSE  IRW:[id number] DOB: [birth year] DOA: 01/07/2021 PCP: Romualdo Bolk, FNP    Brief Narrative:  NASYA VINCENT is a 65 y.o. female with medical history significant for obesity with complications of sleep apnea, GERD with hiatal hernia who presents to the ER for evaluation of pain in the right side of her neck, right shoulder and arm which she rated a 10 x 10 in intensity at its worst.  She states that she has had pain for about 4 days and decided to present to the ER for further evaluation due to worsening and increased intensity of the pain  12/9 has no sob, cp, .   Consultants:  Neurosurgery, oncology  Procedures:   Antimicrobials:      Subjective: With mild rt shoulder pain. No other complaints.   Objective: Vitals:   01/08/21 1624 01/08/21 2141 01/09/21 0310 01/09/21 0310  BP: (!) 154/75 (!) 110/59 108/63 108/63  Pulse: 86 77 79 85  Resp: 19 19 18 18   Temp: 98 F (36.7 C) 98 F (36.7 C) 98 F (36.7 C) 98 F (36.7 C)  TempSrc: Oral     SpO2: 93% 92% 94% 94%  Weight:      Height:       No intake or output data in the 24 hours ending 01/09/21 0827 Filed Weights   01/07/21 0504  Weight: 107 kg    Examination: Nad,calm Cta no w/r/r Regular s1/s2 no gallop Soft benign +bs  No edema aaxoxo3    Data Reviewed: I have personally reviewed following labs and imaging studies  CBC: Recent Labs  Lab 01/07/21 0517 01/08/21 0618  WBC 7.6 9.0  NEUTROABS 2.8  --   HGB 12.9 13.5  HCT 41.7 41.3  MCV 99.0 91.0  PLT 261 761   Basic Metabolic Panel: Recent Labs  Lab 01/07/21 0517 01/08/21 0618  NA 137 135  K 4.5 4.3  CL 107 102  CO2 24 25  GLUCOSE 87 144*  BUN 21 21  CREATININE 0.83 0.91  CALCIUM 8.6* 9.5   GFR: Estimated Creatinine Clearance: 75.5 mL/min (by C-G formula based on SCr of 0.91 mg/dL). Liver Function Tests: Recent Labs  Lab 01/08/21 0618  AST 20  ALT 13  ALKPHOS 65  BILITOT 0.6  PROT 7.2   ALBUMIN 3.5   No results for input(s): LIPASE, AMYLASE in the last 168 hours. No results for input(s): AMMONIA in the last 168 hours. Coagulation Profile: No results for input(s): INR, PROTIME in the last 168 hours. Cardiac Enzymes: No results for input(s): CKTOTAL, CKMB, CKMBINDEX, TROPONINI in the last 168 hours. BNP (last 3 results) No results for input(s): PROBNP in the last 8760 hours. HbA1C: No results for input(s): HGBA1C in the last 72 hours. CBG: No results for input(s): GLUCAP in the last 168 hours. Lipid Profile: No results for input(s): CHOL, HDL, LDLCALC, TRIG, CHOLHDL, LDLDIRECT in the last 72 hours. Thyroid Function Tests: No results for input(s): TSH, T4TOTAL, FREET4, T3FREE, THYROIDAB in the last 72 hours. Anemia Panel: No results for input(s): VITAMINB12, FOLATE, FERRITIN, TIBC, IRON, RETICCTPCT in the last 72 hours. Sepsis Labs: No results for input(s): PROCALCITON, LATICACIDVEN in the last 168 hours.  Recent Results (from the past 240 hour(s))  Resp Panel by RT-PCR (Flu A&B, Covid) Nasopharyngeal Swab     Status: Abnormal   Collection Time: 01/07/21  8:58 AM   Specimen: Nasopharyngeal Swab; Nasopharyngeal(NP) swabs in vial transport medium  Result Value Ref  Range Status   SARS Coronavirus 2 by RT PCR POSITIVE (A) NEGATIVE Final    Comment: RESULT CALLED TO, READ BACK BY AND VERIFIED WITH: LINDSAY BLACK@1050  01/07/21 KLW    Influenza A by PCR NEGATIVE NEGATIVE Final   Influenza B by PCR NEGATIVE NEGATIVE Final    Comment: Performed at [facility], [address], [place], [place] [address]         Radiology Studies: CT SOFT TISSUE NECK W CONTRAST  Result Date: 01/08/2021 CLINICAL DATA:  Neck mass, nonpulsatile thyroid mass EXAM: CT NECK WITH CONTRAST TECHNIQUE: Multidetector CT imaging of the neck was performed using the standard protocol following the bolus administration of intravenous contrast. CONTRAST:  80mL OMNIPAQUE IOHEXOL 300 MG/ML   SOLN COMPARISON:  Correlation made with CT chest 01/07/2021 FINDINGS: Pharynx and larynx: Unremarkable.  No mass or swelling. Salivary glands: Parotid and submandibular glands are unremarkable. 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 There is leftward displacement of the trachea with mild flattening in the transverse dimension. Lymph nodes: No enlarged or abnormal density nodes. Vascular: Major neck vessels are patent. Retropharyngeal course of the proximal ICA on the left. Limited intracranial: No abnormal enhancement. Visualized orbits: Unremarkable. Mastoids and visualized paranasal sinuses: Evidence of prior sinonasal surgery. Mastoid air cells are clear. Skeleton: Upper cervical spine degenerative changes. Upper chest: No new abnormality. Other: None. 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 Leftward displacement of trachea with mild flattening. No enlarged nodes. Recommend ultrasound evaluation. Necessity of tissue sampling can be determined at that time. Electronically Signed   By: Macy Mis M.D.   On: 01/08/2021 12:21   MR Brain W and Wo Contrast  Result Date: 01/07/2021 CLINICAL DATA:  Brain metastases suspected. Additional history provided: Intermittent frontal headaches over the last 4 months. Outpatient CT showing isodense 3.2 cm mass with surrounding vasogenic edema. EXAM: MRI HEAD WITHOUT AND WITH CONTRAST TECHNIQUE: Multiplanar, multiecho pulse sequences of the brain and surrounding structures were obtained without and with intravenous contrast. CONTRAST:  49mL GADAVIST GADOBUTROL 1 MMOL/ML IV SOLN COMPARISON:  No pertinent prior exams available for comparison. FINDINGS: Brain: Cerebral volume appears normal for age. There is a 3.8 x 4.1 x 3.7 cm extra-axial, dural-based mass centered along the mid-falx. The mass demonstrates somewhat heterogeneous enhancement. A greater volume of tumor is present to the left of the falx  than to the right. There is suspected invasion of the adjacent superior sagittal sinus. Mass effect on the posterior frontal lobes (left greater than right). Edema within the underlying frontal lobes, moderate on the left and mild on the right. Partial effacement of the lateral ventricles. 2 mm rightward midline shift measured at the level the septum pellucidum. Prominent perivascular space versus chronic lacunar infarct within the anterior limb of left internal capsule (series 16, image 12). Partially empty sella turcica. No cortical encephalomalacia is identified. There is no acute infarct. No chronic intracranial blood products are identified, although susceptibility artifact along the left frontoparietal vertex limits evaluation. No extra-axial fluid collection. Vascular: Maintained flow voids within the proximal large arterial vessels. 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 Skull and upper cervical spine: No focal suspicious marrow lesion. Sinuses/Orbits: Visualized orbits show no acute finding. No significant paranasal sinus disease. IMPRESSION: 4.1 x 3.8 cm extra-axial dural-based mass centered along the mid-falx, as described and favored to reflect a meningioma. 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 Mass effect upon the posterior frontal lobes, left greater  than right. Additionally, there is edema within the underlying frontal lobes, moderate on the left and mild on the right. Partial effacement of the lateral ventricles. 2 mm rightward midline shift measured at the level of the septum pellucidum. Prominent perivascular space versus chronic lacunar infarct within the anterior limb of left internal capsule. Electronically Signed   By: Kellie Simmering D.O.   On: 01/07/2021 12:20   MR MRV HEAD WO CM  Result Date: 01/07/2021 CLINICAL DATA:  Dural venous sinus thrombosis suspected EXAM: MR VENOGRAM HEAD WITHOUT CONTRAST TECHNIQUE: Angiographic images  of the intracranial venous structures were acquired using MRV technique without intravenous contrast. COMPARISON:  No prior MRV, correlation is made with same day MRI head with and without contrast FINDINGS: No signal is seen within the superior sagittal sinus in the location of the dural based mass seen on the same-day MRI head (series 10, image 22), likely reflecting invasion of the superior sagittal sinus. The superior sagittal sinus is otherwise patent There is no other evidence of dural venous sinus or deep cerebral vein thrombosis. Focal narrowing in the right proximal transverse sinus is felt to be artifactual, given contrast in this location on the same-day MRI. IMPRESSION: 1. 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 2. No other evidence of dural venous sinus thrombosis or stenosis. Electronically Signed   By: Merilyn Baba M.D.   On: 01/07/2021 19:55        Scheduled Meds:  cholecalciferol  5,000 Units Oral Daily   citalopram  20 mg Oral Daily   dexamethasone (DECADRON) injection  8 mg Intravenous Q8H   multivitamins with iron  1 tablet Oral Daily   sodium chloride flush  3 mL Intravenous Q12H   Continuous Infusions:  sodium chloride     remdesivir 100 mg in NS 100 mL      Assessment & Plan:   Principal Problem:   Neck mass Active Problems:   Depressive disorder   GERD (gastroesophageal reflux disease)   Obesity, Class III, BMI 40-49.9 (morbid obesity) (HCC)   Vasogenic brain edema [facility])   Patient is a 65 year old female who presents for evaluation of pain in her right shoulder and the right side of her neck and is found to have a thyroid mass concerning for thyroid neoplasm.     Thyroid mass Concerning for primary thyroid neoplasm  12/9 need thryoid [place] and bx , but all w/u will be done as outpatient. Discussed with oncology    Brain mass-   Neurosurgery consulted, input appreciated, likely meningioma.  Started on decadron iv due to evidence of vasogenic edema MRV for further evaluation  Plan for outpatient f/u with Dr. Lacinda Axon for further discussion about tx of cranial mass 12/9 per neurosurgery on DC to p.o. dexamethasone 2 mg twice daily x2 weeks and they will take over than     Covid + On RA Asx 12/9 we will continue IV remdesivir, #2 today will receive #3 dose tomorrow and can discharge afterwards.   LFTs were normal  IS    Morbid obesity (BMI 60.6 ) With complications of obstructive sleep apnea 30/1 complicates overall prognosis and care           Anxiety and depression Continue celexa       DVT prophylaxis: SCD Code Status: Full Family Communication: Daughter at bedside Disposition Plan:  Status is: Inpatient  Remains inpatient  appropriate because: Requiring IV treatment.             LOS: 2 days   Time spent: 35 minutes with more than 50% on [place], MD [facility] Pager [phone]  If 7PM-7AM, please contact night-coverage 01/09/2021, 8:27 AM

## 2021-01-10 MED ORDER — DEXAMETHASONE 2 MG PO TABS: 2.0000 mg | ORAL_TABLET | Freq: Two times a day (BID) | ORAL | 0 refills | Status: AC

## 2021-01-10 MED ORDER — PANTOPRAZOLE SODIUM 20 MG PO TBEC: 20.0000 mg | DELAYED_RELEASE_TABLET | Freq: Every day | ORAL | 0 refills | Status: DC

## 2021-01-10 NOTE — Discharge Summary (Signed)
Regina Petty JJH:[id number] DOB: Aug 23, 1955 DOA: 01/07/2021  PCP: Romualdo Bolk, FNP  Admit date: 01/07/2021 Discharge date: 01/10/2021  Admitted From: home Disposition:  home  Recommendations for Outpatient Follow-up:  Follow up with PCP in 1 week Please obtain BMP/CBC in one week Please follow up on the following Dr. Lacinda Axon neurosurgery in 1 week follow-up with Dr. Grayland Ormond within 1 week      Discharge Condition:Stable CODE STATUS: Full Diet recommendation: Heart Healthy  Brief/Interim Summary: Per Regina Petty is a 65 y.o. female with medical history significant for obesity with complications of sleep apnea, GERD with hiatal hernia who presents to the ER for evaluation of pain in the right side of her neck, right shoulder and arm which she rated a 10 x 10 in intensity at its worst.  She states that she has had pain for about 4 days and decided to present to the ER for further evaluation due to worsening and increased intensity of the pain. She also gives a history of intermittent frontal headaches which she has had over the last 4 months.  She rates the headaches an 8 x 10 in intensity at its worst and denies having any associated nausea, no vomiting or photosensitivity.  She denies having a history of migraines.  She had an outpatient CT scan of the head done due to family history of aneurysms and persistent headache which showed an isodense lesion measuring 3.2 x 3.1 x 3.0 cm with surrounding vasogenic edema located in the interhemispheric fissure.  No evidence of an acute infarct.  No acute intracranial hemorrhage. She also complains of chest pressure but denies having any shortness of breath, no palpitations or diaphoresis.  Chest x-ray reviewed by me shows new masslike fullness in the high right paratracheal region concerning for potential neoplasm or lymphadenopathy. CT angiogram of the chest shows no evidence of pulmonary embolism.  Asymmetric masslike enlargement of  the right lobe of the thyroid gland which has clearly progressed compared to prior examinations based on old chest radiographs.  This finding is concerning for potential primary thyroid neoplasm.  Mildly enlarged subcarinal lymph node.  The possibility of metastatic disease should be considered. 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  CT scan of the head without contrast done at an outside hospital which showed an isodense lesion measuring 3.2 x 3.1 x 3.0 cm with surrounding vasogenic edema located in the interhemispheric fissure.  No evidence of an acute infarct.  No acute intracranial hemorrhage. Oncology on-call was consulted by ED that they recommended starting patient on Decadron IV.  Neurosurgery was consulted.   Patient was also found with COVID-positive.  Started on remdesivir which she received 3 days.  Thyroid mass Concerning for primary thyroid neoplasm  12/9 need thryoid [place] and bx , but all w/u will be done as outpatient. 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  Started on decadron iv due to evidence of vasogenic edema MRV for further evaluation  Plan for outpatient f/u with Dr. Lacinda Axon for further discussion about tx of cranial mass Per neurosurgery on discharge switch IV Decadron to p.o. dexamethasone 2 mg twice daily x2 weeks and they will take over then       Covid + On room air Asymptomatic Received 3 doses of IV remdesivir LFTs were normal Needs 7 more days of quarantine.  Discussed with the patient.     Morbid obesity (BMI 10.9 ) With complications of obstructive sleep apnea complicates overall prognosis and care      Anxiety and depression Continue home meds             Discharge Diagnoses:  Principal Problem:   Neck mass Active Problems:   Depressive disorder    GERD (gastroesophageal reflux disease)   Obesity, Class III, BMI 40-49.9 (morbid obesity) ([place])   Vasogenic brain edema [facility])    Discharge Instructions  Discharge Instructions     Call MD for:  difficulty breathing, headache or visual disturbances   Complete by: As directed    Diet - low sodium heart healthy   Complete by: As directed    Discharge instructions   Complete by: As directed    Start steroid tomorrow Need 7 more days of quarantine at home   Increase activity slowly   Complete by: As directed       Allergies as of 01/10/2021       Reactions   Hydrocodone Nausea And Vomiting   Latex Rash   Nickel Rash        Medication List     STOP taking these medications    amoxicillin 875 MG tablet Commonly known as: AMOXIL   ibuprofen 200 MG tablet Commonly known as: ADVIL   naproxen 500 MG tablet Commonly known as: NAPROSYN   oxyCODONE-acetaminophen 5-325 MG tablet Commonly known as: PERCOCET/ROXICET       TAKE these medications    ALPRAZolam 0.5 MG tablet Commonly known as: XANAX Take 0.5 mg by mouth at bedtime as needed for anxiety.   cetirizine 10 MG tablet Commonly known as: ZYRTEC Take 10 mg by mouth daily.   Cholecalciferol 125 MCG (5000 UT) Tabs Take 1 tablet by mouth in the morning.   citalopram 20 MG tablet Commonly known as: CELEXA Take 20 mg by mouth daily.   Daily-Vite/Iron/Beta-Carotene Tabs Take 1 tablet by mouth daily.   dexamethasone 2 MG tablet Commonly known as: DECADRON Take 1 tablet (2 mg total) by mouth 2 (two) times daily for 14 days.   fluticasone 50 MCG/ACT nasal spray Commonly known as: FLONASE Place into both nostrils daily.   pantoprazole 20 MG tablet Commonly known as: Protonix Take 1 tablet (20 mg total) by mouth daily.   [place] 81 MG Caps Generic drug: Aspirin Take 1 tablet by mouth daily.        Follow-up Information     Romualdo Bolk, FNP Follow up in 8 day(s).   Specialty: Nurse  Practitioner Contact information: [address] [place] [address] [phone]         Deetta Perla, MD Follow up in 1 week(s).   Specialty: Neurosurgery Contact information: [place] [address] [phone]         Lloyd Huger, MD Follow up in 4 day(s).   Specialty: Oncology Contact information: [place] [address] [phone]                Allergies  Allergen Reactions   Hydrocodone Nausea And Vomiting   Latex Rash   Nickel Rash    Consultations: Neurosurgery, oncology   Procedures/Studies: DG Chest 2 View  Result Date: 01/07/2021 CLINICAL DATA:  65 year old female with history of right-sided chest pain. EXAM: CHEST - 2 VIEW COMPARISON:  Chest x-ray 10/26/2013. FINDINGS: New masslike fullness in the high right paratracheal region causing some deviation  of the trachea to the left. Lung volumes are normal. No consolidative airspace disease. No pleural effusions. Mild diffuse interstitial prominence. No evidence of pulmonary edema. Heart size is normal. IMPRESSION: 1. New mass-like fullness in the high right paratracheal region concerning for potential neoplasm or lymphadenopathy. Further evaluation with contrast enhanced chest CT is recommended at this time to better evaluate this finding. Electronically Signed   By: Vinnie Langton M.D.   On: 01/07/2021 06:16   DG Shoulder Right  Result Date: 01/07/2021 CLINICAL DATA:  65 year old female with history of right-sided shoulder pain. EXAM: RIGHT SHOULDER - 2+ VIEW COMPARISON:  Right shoulder radiographs 06/10/2017. FINDINGS: Three views of the right shoulder demonstrate no acute displaced fracture or dislocation. Humeral head is slightly high riding, suggesting underlying rotator cuff pathology. There is joint space narrowing, subchondral sclerosis and osteophyte formation in the glenohumeral and acromioclavicular joints, compatible with osteoarthritis.  IMPRESSION: 1. No acute radiographic abnormality of the right shoulder. 2. 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 Electronically Signed   By: Vinnie Langton M.D.   On: 01/07/2021 06:17   CT SOFT TISSUE NECK W CONTRAST  Result Date: 01/08/2021 CLINICAL DATA:  Neck mass, nonpulsatile thyroid mass EXAM: CT NECK WITH CONTRAST TECHNIQUE: Multidetector CT imaging of the neck was performed using the standard protocol following the bolus administration of intravenous contrast. CONTRAST:  22mL OMNIPAQUE IOHEXOL 300 MG/ML  SOLN COMPARISON:  Correlation made with CT chest 01/07/2021 FINDINGS: Pharynx and larynx: Unremarkable.  No mass or swelling. Salivary glands: Parotid and submandibular glands are unremarkable. 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 There is leftward displacement of the trachea with mild flattening in the transverse dimension. Lymph nodes: No enlarged or abnormal density nodes. Vascular: Major neck vessels are patent. Retropharyngeal course of the proximal ICA on the left. Limited intracranial: No abnormal enhancement. Visualized orbits: Unremarkable. Mastoids and visualized paranasal sinuses: Evidence of prior sinonasal surgery. Mastoid air cells are clear. Skeleton: Upper cervical spine degenerative changes. Upper chest: No new abnormality. Other: None. 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 Leftward displacement of trachea with mild flattening. No enlarged nodes. Recommend ultrasound evaluation. Necessity of tissue sampling can be determined at that time. Electronically Signed   By: Macy Mis M.D.   On: 01/08/2021 12:21   CT Angio Chest PE W and/or Wo Contrast  Result Date: 01/07/2021 CLINICAL DATA:  65 year old female with history of chest pain. Possible mass noted on chest x-ray. Follow-up  study. EXAM: CT ANGIOGRAPHY CHEST WITH CONTRAST TECHNIQUE: Multidetector CT imaging of the chest was performed using the standard protocol during bolus administration of intravenous contrast. Multiplanar CT image reconstructions and MIPs were obtained to evaluate the vascular anatomy. CONTRAST:  34mL OMNIPAQUE IOHEXOL 350 MG/ML SOLN COMPARISON:  No priors.  Chest x-ray 01/07/2021. FINDINGS: Cardiovascular: No filling defects are noted within the pulmonary arterial tree to suggest pulmonary embolism. Heart size is normal. There is no significant pericardial fluid, thickening or pericardial calcification. No atherosclerotic calcifications are noted in the thoracic aorta or the coronary arteries. Mediastinum/Nodes: The previously described high right paratracheal mass corresponds to asymmetric mass-like enlargement of the right lobe of the thyroid gland which is incompletely imaged on today's study, but estimated to measure at least 5.7 x 3.3 x 6.9 cm. Enlarged subcarinal lymph node also noted measuring 1.7 cm in short axis. Esophagus is unremarkable in appearance. No axillary lymphadenopathy. Lungs/Pleura: Status  post sleeve gastrectomy. Upper Abdomen: There are no aggressive appearing lytic or blastic lesions noted in the visualized portions of the skeleton. Musculoskeletal: There are no aggressive appearing lytic or blastic lesions noted in the visualized portions of the skeleton. Review of the MIP images confirms the above findings. IMPRESSION: 1. No evidence of pulmonary embolism. 2. Asymmetric mass-like enlargement of the right lobe of the thyroid gland which has clearly progressed compared to prior examinations based on old chest radiographs. This finding is concerning for potential primary thyroid neoplasm. Further clinical evaluation is recommended. 3. Mildly enlarged subcarinal lymph node. The possibility of metastatic disease should be considered. Electronically Signed   By: Vinnie Langton M.D.   On:  01/07/2021 07:40   MR Brain W and Wo Contrast  Result Date: 01/07/2021 CLINICAL DATA:  Brain metastases suspected. Additional history provided: Intermittent frontal headaches over the last 4 months. Outpatient CT showing isodense 3.2 cm mass with surrounding vasogenic edema. EXAM: MRI HEAD WITHOUT AND WITH CONTRAST TECHNIQUE: Multiplanar, multiecho pulse sequences of the brain and surrounding structures were obtained without and with intravenous contrast. CONTRAST:  70mL GADAVIST GADOBUTROL 1 MMOL/ML IV SOLN COMPARISON:  No pertinent prior exams available for comparison. FINDINGS: Brain: Cerebral volume appears normal for age. There is a 3.8 x 4.1 x 3.7 cm extra-axial, dural-based mass centered along the mid-falx. The mass demonstrates somewhat heterogeneous enhancement. A greater volume of tumor is present to the left of the falx than to the right. There is suspected invasion of the adjacent superior sagittal sinus. Mass effect on the posterior frontal lobes (left greater than right). Edema within the underlying frontal lobes, moderate on the left and mild on the right. Partial effacement of the lateral ventricles. 2 mm rightward midline shift measured at the level the septum pellucidum. Prominent perivascular space versus chronic lacunar infarct within the anterior limb of left internal capsule (series 16, image 12). Partially empty sella turcica. No cortical encephalomalacia is identified. There is no acute infarct. No chronic intracranial blood products are identified, although susceptibility artifact along the left frontoparietal vertex limits evaluation. No extra-axial fluid collection. Vascular: Maintained flow voids within the proximal large arterial vessels. 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 Skull and upper cervical spine: No focal suspicious marrow lesion. Sinuses/Orbits: Visualized orbits show no acute finding. No significant paranasal sinus disease.  IMPRESSION: 4.1 x 3.8 cm extra-axial dural-based mass centered along the mid-falx, as described and favored to reflect a meningioma. 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 Mass effect upon the posterior frontal lobes, left greater than right. Additionally, there is edema within the underlying frontal lobes, moderate on the left and mild on the right. Partial effacement of the lateral ventricles. 2 mm rightward midline shift measured at the level of the septum pellucidum. Prominent perivascular space versus chronic lacunar infarct within the anterior limb of left internal capsule. Electronically Signed   By: Kellie Simmering D.O.   On: 01/07/2021 12:20   MR MRV HEAD WO CM  Result Date: 01/07/2021 CLINICAL DATA:  Dural venous sinus thrombosis suspected EXAM: MR VENOGRAM HEAD WITHOUT CONTRAST TECHNIQUE: Angiographic images of the intracranial venous structures were acquired using MRV technique without intravenous contrast. COMPARISON:  No prior MRV, correlation is made with same day MRI head with and without contrast FINDINGS: No signal is seen within the superior sagittal sinus in the location of the dural based mass seen on the same-day MRI head (series 10, image 22), likely reflecting invasion of  the superior sagittal sinus. The superior sagittal sinus is otherwise patent There is no other evidence of dural venous sinus or deep cerebral vein thrombosis. Focal narrowing in the right proximal transverse sinus is felt to be artifactual, given contrast in this location on the same-day MRI. IMPRESSION: 1. 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 2. No other evidence of dural venous sinus thrombosis or stenosis. Electronically Signed   By: Merilyn Baba M.D.   On: 01/07/2021 19:55      Subjective: No shortness of breath, chest pain, or dizziness  Discharge  Exam: Vitals:   01/10/21 0443 01/10/21 0728  BP: 138/76 (!) 114/56  Pulse: 69 65  Resp: 16 16  Temp:  98.2 F (36.8 C)  SpO2: 100% 100%   Vitals:   01/09/21 1531 01/09/21 2148 01/10/21 0443 01/10/21 0728  BP: 117/73 125/69 138/76 (!) 114/56  Pulse: 78 (!) 59 69 65  Resp: 18 16 16 16   Temp: 98.7 F (37.1 C) 98 F (36.7 C)  98.2 F (36.8 C)  TempSrc: Oral Oral  Oral  SpO2: 100% 96% 100% 100%  Weight:      Height:        General: Pt is alert, awake, not in acute distress Cardiovascular: RRR, S1/S2 +, no rubs, no gallops Respiratory: CTA bilaterally, no wheezing, no rhonchi Abdominal: Soft, NT, ND, bowel sounds + Extremities: no edema, no cyanosis    The results of significant diagnostics from this hospitalization (including imaging, microbiology, ancillary and laboratory) are listed below for reference.     Microbiology: Recent Results (from the past 240 hour(s))  Resp Panel by RT-PCR (Flu A&B, Covid) Nasopharyngeal Swab     Status: Abnormal   Collection Time: 01/07/21  8:58 AM   Specimen: Nasopharyngeal Swab; Nasopharyngeal(NP) swabs in vial transport medium  Result Value Ref Range Status   SARS Coronavirus 2 by RT PCR POSITIVE (A) NEGATIVE Final    Comment: RESULT CALLED TO, READ BACK BY AND VERIFIED WITH: LINDSAY BLACK@1050  01/07/21 KLW    Influenza A by PCR NEGATIVE NEGATIVE Final   Influenza B by PCR NEGATIVE NEGATIVE Final    Comment: Performed at [facility], [address] [place], [place] [address]     Labs: BNP (last 3 results) No results for input(s): BNP in the last 8760 hours. Basic Metabolic Panel: Recent Labs  Lab 01/07/21 0517 01/08/21 0618  NA 137 135  K 4.5 4.3  CL 107 102  CO2 24 25  GLUCOSE 87 144*  BUN 21 21  CREATININE 0.83 0.91  CALCIUM 8.6* 9.5   Liver Function Tests: Recent Labs  Lab 01/08/21 0618  AST 20  ALT 13  ALKPHOS 65  BILITOT 0.6  PROT 7.2  ALBUMIN 3.5   No results for input(s): LIPASE, AMYLASE in  the last 168 hours. No results for input(s): AMMONIA in the last 168 hours. CBC: Recent Labs  Lab 01/07/21 0517 01/08/21 0618  WBC 7.6 9.0  NEUTROABS 2.8  --   HGB 12.9 13.5  HCT 41.7 41.3  MCV 99.0 91.0  PLT 261 300   Cardiac Enzymes: No results for input(s): CKTOTAL, CKMB, CKMBINDEX, TROPONINI in the last 168 hours. BNP: Invalid input(s): POCBNP CBG: Recent Labs  Lab 01/09/21 0940 01/09/21 1219 01/09/21 1529  GLUCAP 156* 94 129*   D-Dimer No results for input(s): DDIMER in the last 72 hours.  Hgb A1c No results for input(s): HGBA1C in the last 72 hours. Lipid Profile No results for input(s): CHOL, HDL, LDLCALC, TRIG, CHOLHDL, LDLDIRECT in the last 72 hours. Thyroid function studies No results for input(s): TSH, T4TOTAL, T3FREE, THYROIDAB in the last 72 hours.  Invalid input(s): FREET3 Anemia work up No results for input(s): VITAMINB12, FOLATE, FERRITIN, TIBC, IRON, RETICCTPCT in the last 72 hours. Urinalysis No results found for: COLORURINE, APPEARANCEUR, [place], [place], GLUCOSEU, [place], [place], [place], PROTEINUR, UROBILINOGEN, NITRITE, LEUKOCYTESUR Sepsis Labs Invalid input(s): PROCALCITONIN,  WBC,  LACTICIDVEN Microbiology Recent Results (from the past 240 hour(s))  Resp Panel by RT-PCR (Flu A&B, Covid) Nasopharyngeal Swab     Status: Abnormal   Collection Time: 01/07/21  8:58 AM   Specimen: Nasopharyngeal Swab; Nasopharyngeal(NP) swabs in vial transport medium  Result Value Ref Range Status   SARS Coronavirus 2 by RT PCR POSITIVE (A) NEGATIVE Final    Comment: RESULT CALLED TO, READ BACK BY AND VERIFIED WITH: LINDSAY BLACK@1050  01/07/21 KLW    Influenza A by PCR NEGATIVE NEGATIVE Final   Influenza B by PCR NEGATIVE NEGATIVE Final    Comment: Performed at [facility], [address], [place], [place] [address]     Time coordinating discharge: Over 30 minutes  SIGNED:   Nolberto Hanlon, MD  [facility] 01/10/2021, 9:39  AM Pager   If 7PM-7AM, please contact night-coverage [email or web address] Password [email or web address]

## 2021-01-16 ENCOUNTER — Telehealth: Payer: Self-pay | Admitting: Oncology

## 2021-01-16 NOTE — Telephone Encounter (Signed)
Pt called to schedule an follow-up appt. Call back at [phone]

## 2021-01-26 DIAGNOSIS — G9389 Other specified disorders of brain: Secondary | ICD-10-CM | POA: Insufficient documentation

## 2021-01-26 DIAGNOSIS — E079 Disorder of thyroid, unspecified: Secondary | ICD-10-CM | POA: Insufficient documentation

## 2021-01-26 NOTE — Progress Notes (Signed)
[place]  [phone]) [phone] [phone]) [phone]  ID: Regina Petty OB: Feb 19, 1955  MR#: [id number]  JOA#:[id number]  Patient Care Team: Romualdo Bolk, FNP as PCP - General (Nurse Practitioner)  CHIEF COMPLAINT: Thyroid mass and brain mass.  INTERVAL HISTORY: Patient returns to clinic today for hospital follow-up and further diagnostic planning.  She continues to have right shoulder pain, but otherwise feels well.  She does not complain of headaches today.  She has no neurologic complaints.  She denies any recent fevers or illnesses.  She has a good appetite and denies weight loss.  She has no chest pain, shortness of breath, cough, or hemoptysis.  She denies any nausea, vomiting, constipation, or diarrhea.  She has no melena or hematochezia.  She has no urinary complaints.  Patient offers no further specific complaints today.  REVIEW OF SYSTEMS:   Review of Systems  Constitutional: Negative.  Negative for fever, malaise/fatigue and weight loss.  Respiratory: Negative.  Negative for cough, hemoptysis and shortness of breath.   Cardiovascular: Negative.  Negative for chest pain and leg swelling.  Gastrointestinal: Negative.  Negative for abdominal pain.  Genitourinary: Negative.  Negative for dysuria.  Musculoskeletal:  Positive for joint pain.  Skin: Negative.  Negative for rash.  Neurological: Negative.  Negative for dizziness, speech change, focal weakness and headaches.  Psychiatric/Behavioral: Negative.  The patient is not nervous/anxious.    As per HPI. Otherwise, a complete review of systems is negative.  PAST MEDICAL HISTORY: Past Medical History:  Diagnosis Date   Depressive disorder    GERD (gastroesophageal reflux disease)    Hiatal hernia    Macromastia    Obesity    Post-menopausal bleeding    Sleep apnea     PAST SURGICAL HISTORY: Past Surgical History:  Procedure Laterality Date   BARIATRIC SURGERY     buionectomy     COLON  SURGERY     ENDOMETRIAL ABLATION     gastectomy     GASTRIC RESTRICTION SURGERY     LAPAROSCOPIC PARTIAL PROTECTOMY     NASAL SINUS SURGERY     TUBAL LIGATION     UGE      FAMILY HISTORY: Family History  Problem Relation Age of Onset   Heart failure Mother    Stroke Father    Diabetes Father     ADVANCED DIRECTIVES (Y/N):  N  HEALTH MAINTENANCE: Social History   Tobacco Use   Smoking status: Former    Types: Cigarettes    Quit date: 1995    Years since quitting: 28.0   Smokeless tobacco: Never  Vaping Use   Vaping Use: Never used  Substance Use Topics   Alcohol use: Yes   Drug use: No     Colonoscopy:  PAP:  Bone density:  Lipid panel:  Allergies  Allergen Reactions   Dexamethasone Anxiety, Dermatitis, Hives, Itching and Rash   Hydrocodone Nausea And Vomiting   Latex Rash   Nickel Rash    Current Outpatient Medications  Medication Sig Dispense Refill   ALPRAZolam (XANAX) 0.5 MG tablet Take 0.5 mg by mouth at bedtime as needed for anxiety.     Aspirin (VAZALORE) 81 MG CAPS Take 1 tablet by mouth daily.     cetirizine (ZYRTEC) 10 MG tablet Take 10 mg by mouth daily.     Cholecalciferol 125 MCG (5000 UT) TABS Take 1 tablet by mouth in the morning.     citalopram (CELEXA) 20 MG tablet Take 20 mg by  mouth daily.     fluticasone (FLONASE) 50 MCG/ACT nasal spray Place into both nostrils daily.     Multiple Vitamins-Iron (DAILY-VITE/IRON/BETA-CAROTENE) TABS Take 1 tablet by mouth daily.     ibuprofen (ADVIL) 200 MG tablet Take by mouth.     No current facility-administered medications for this visit.    OBJECTIVE: Vitals:   01/27/21 0940  BP: (!) 142/82  Pulse: 74  Resp: 16  Temp: (!) 97.3 F (36.3 C)  SpO2: 97%     Body mass index is 41.09 kg/m.    ECOG FS:0 - Asymptomatic  General: Well-developed, well-nourished, no acute distress. Eyes: Pink conjunctiva, anicteric sclera. HEENT: Normocephalic, moist mucous membranes. Lungs: No audible wheezing  or coughing. Heart: Regular rate and rhythm. Abdomen: Soft, nontender, no obvious distention. Musculoskeletal: No edema, cyanosis, or clubbing. Neuro: Alert, answering all questions appropriately. Cranial nerves grossly intact. Skin: No rashes or petechiae noted. Psych: Normal affect.  LAB RESULTS:  Lab Results  Component Value Date   NA 135 01/08/2021   K 4.3 01/08/2021   CL 102 01/08/2021   CO2 25 01/08/2021   GLUCOSE 144 (H) 01/08/2021   BUN 21 01/08/2021   CREATININE 0.91 01/08/2021   CALCIUM 9.5 01/08/2021   PROT 7.2 01/08/2021   ALBUMIN 3.5 01/08/2021   AST 20 01/08/2021   ALT 13 01/08/2021   ALKPHOS 65 01/08/2021   BILITOT 0.6 01/08/2021   GFRNONAA >60 01/08/2021    Lab Results  Component Value Date   WBC 9.0 01/08/2021   NEUTROABS 2.8 01/07/2021   HGB 13.5 01/08/2021   HCT 41.3 01/08/2021   MCV 91.0 01/08/2021   PLT 300 01/08/2021     STUDIES: DG Chest 2 View  Result Date: 01/07/2021 CLINICAL DATA:  65 year old female with history of right-sided chest pain. EXAM: CHEST - 2 VIEW COMPARISON:  Chest x-ray 10/26/2013. FINDINGS: New masslike fullness in the high right paratracheal region causing some deviation of the trachea to the left. Lung volumes are normal. No consolidative airspace disease. No pleural effusions. Mild diffuse interstitial prominence. No evidence of pulmonary edema. Heart size is normal. IMPRESSION: 1. New mass-like fullness in the high right paratracheal region concerning for potential neoplasm or lymphadenopathy. Further evaluation with contrast enhanced chest CT is recommended at this time to better evaluate this finding. Electronically Signed   By: Vinnie Langton M.D.   On: 01/07/2021 06:16   DG Shoulder Right  Result Date: 01/07/2021 CLINICAL DATA:  65 year old female with history of right-sided shoulder pain. EXAM: RIGHT SHOULDER - 2+ VIEW COMPARISON:  Right shoulder radiographs 06/10/2017. FINDINGS: Three views of the right shoulder  demonstrate no acute displaced fracture or dislocation. Humeral head is slightly high riding, suggesting underlying rotator cuff pathology. There is joint space narrowing, subchondral sclerosis and osteophyte formation in the glenohumeral and acromioclavicular joints, compatible with osteoarthritis. IMPRESSION: 1. No acute radiographic abnormality of the right shoulder. 2. 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 Electronically Signed   By: Vinnie Langton M.D.   On: 01/07/2021 06:17   CT SOFT TISSUE NECK W CONTRAST  Result Date: 01/08/2021 CLINICAL DATA:  Neck mass, nonpulsatile thyroid mass EXAM: CT NECK WITH CONTRAST TECHNIQUE: Multidetector CT imaging of the neck was performed using the standard protocol following the bolus administration of intravenous contrast. CONTRAST:  70mL OMNIPAQUE IOHEXOL 300 MG/ML  SOLN COMPARISON:  Correlation made with CT chest 01/07/2021 FINDINGS: Pharynx and larynx: Unremarkable.  No mass or swelling. Salivary glands: Parotid and submandibular glands are unremarkable. 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 There is leftward displacement of the trachea with mild flattening in the transverse dimension. Lymph nodes: No enlarged or abnormal density nodes. Vascular: Major neck vessels are patent. Retropharyngeal course of the proximal ICA on the left. Limited intracranial: No abnormal enhancement. Visualized orbits: Unremarkable. Mastoids and visualized paranasal sinuses: Evidence of prior sinonasal surgery. Mastoid air cells are clear. Skeleton: Upper cervical spine degenerative changes. Upper chest: No new abnormality. Other: None. 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 Leftward displacement of trachea with mild flattening. No enlarged nodes. Recommend ultrasound evaluation. Necessity of  tissue sampling can be determined at that time. Electronically Signed   By: Macy Mis M.D.   On: 01/08/2021 12:21   CT Angio Chest PE W and/or Wo Contrast  Result Date: 01/07/2021 CLINICAL DATA:  65 year old female with history of chest pain. Possible mass noted on chest x-ray. Follow-up study. EXAM: CT ANGIOGRAPHY CHEST WITH CONTRAST TECHNIQUE: Multidetector CT imaging of the chest was performed using the standard protocol during bolus administration of intravenous contrast. Multiplanar CT image reconstructions and MIPs were obtained to evaluate the vascular anatomy. CONTRAST:  32mL OMNIPAQUE IOHEXOL 350 MG/ML SOLN COMPARISON:  No priors.  Chest x-ray 01/07/2021. FINDINGS: Cardiovascular: No filling defects are noted within the pulmonary arterial tree to suggest pulmonary embolism. Heart size is normal. There is no significant pericardial fluid, thickening or pericardial calcification. No atherosclerotic calcifications are noted in the thoracic aorta or the coronary arteries. Mediastinum/Nodes: The previously described high right paratracheal mass corresponds to asymmetric mass-like enlargement of the right lobe of the thyroid gland which is incompletely imaged on today's study, but estimated to measure at least 5.7 x 3.3 x 6.9 cm. Enlarged subcarinal lymph node also noted measuring 1.7 cm in short axis. Esophagus is unremarkable in appearance. No axillary lymphadenopathy. Lungs/Pleura: Status post sleeve gastrectomy. Upper Abdomen: There are no aggressive appearing lytic or blastic lesions noted in the visualized portions of the skeleton. Musculoskeletal: There are no aggressive appearing lytic or blastic lesions noted in the visualized portions of the skeleton. Review of the MIP images confirms the above findings. IMPRESSION: 1. No evidence of pulmonary embolism. 2. Asymmetric mass-like enlargement of the right lobe of the thyroid gland which has clearly progressed compared to prior examinations based on  old chest radiographs. This finding is concerning for potential primary thyroid neoplasm. Further clinical evaluation is recommended. 3. Mildly enlarged subcarinal lymph node. The possibility of metastatic disease should be considered. Electronically Signed   By: Vinnie Langton M.D.   On: 01/07/2021 07:40   MR Brain W and Wo Contrast  Result Date: 01/07/2021 CLINICAL DATA:  Brain metastases suspected. Additional history provided: Intermittent frontal headaches over the last 4 months. Outpatient CT showing isodense 3.2 cm mass with surrounding vasogenic edema. EXAM: MRI HEAD WITHOUT AND WITH CONTRAST TECHNIQUE: Multiplanar, multiecho pulse sequences of the brain and surrounding structures were obtained without and with intravenous contrast. CONTRAST:  43mL GADAVIST GADOBUTROL 1 MMOL/ML IV SOLN COMPARISON:  No pertinent prior exams available for comparison. FINDINGS: Brain: Cerebral volume appears normal for age. There is a 3.8 x 4.1 x 3.7 cm extra-axial, dural-based mass centered along the mid-falx. The mass demonstrates somewhat heterogeneous enhancement. A greater volume of tumor is present to the left of the falx than to the right. There is suspected invasion of the adjacent superior sagittal sinus. Mass effect on the posterior  frontal lobes (left greater than right). Edema within the underlying frontal lobes, moderate on the left and mild on the right. Partial effacement of the lateral ventricles. 2 mm rightward midline shift measured at the level the septum pellucidum. Prominent perivascular space versus chronic lacunar infarct within the anterior limb of left internal capsule (series 16, image 12). Partially empty sella turcica. No cortical encephalomalacia is identified. There is no acute infarct. No chronic intracranial blood products are identified, although susceptibility artifact along the left frontoparietal vertex limits evaluation. No extra-axial fluid collection. Vascular: Maintained flow voids  within the proximal large arterial vessels. 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 Skull and upper cervical spine: No focal suspicious marrow lesion. Sinuses/Orbits: Visualized orbits show no acute finding. No significant paranasal sinus disease. IMPRESSION: 4.1 x 3.8 cm extra-axial dural-based mass centered along the mid-falx, as described and favored to reflect a meningioma. 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 Mass effect upon the posterior frontal lobes, left greater than right. Additionally, there is edema within the underlying frontal lobes, moderate on the left and mild on the right. Partial effacement of the lateral ventricles. 2 mm rightward midline shift measured at the level of the septum pellucidum. Prominent perivascular space versus chronic lacunar infarct within the anterior limb of left internal capsule. Electronically Signed   By: Kellie Simmering D.O.   On: 01/07/2021 12:20   MR MRV HEAD WO CM  Result Date: 01/07/2021 CLINICAL DATA:  Dural venous sinus thrombosis suspected EXAM: MR VENOGRAM HEAD WITHOUT CONTRAST TECHNIQUE: Angiographic images of the intracranial venous structures were acquired using MRV technique without intravenous contrast. COMPARISON:  No prior MRV, correlation is made with same day MRI head with and without contrast FINDINGS: No signal is seen within the superior sagittal sinus in the location of the dural based mass seen on the same-day MRI head (series 10, image 22), likely reflecting invasion of the superior sagittal sinus. The superior sagittal sinus is otherwise patent There is no other evidence of dural venous sinus or deep cerebral vein thrombosis. Focal narrowing in the right proximal transverse sinus is felt to be artifactual, given contrast in this location on the same-day MRI. IMPRESSION: 1. Absence of signal in the superior sagittal sinus from the location of the meningioma seen on the same-day MRI,  likely reflecting involvement of the superior sagittal sinus. 2. No other evidence of dural venous sinus thrombosis or stenosis. Electronically Signed   By: Merilyn Baba M.D.   On: 01/07/2021 19:55    ASSESSMENT: Thyroid mass and brain mass.  PLAN:    Thyroid mass: CT scan results from 08/07/2020 reviewed independently reported as above revealing a 7 cm mass which appears to be evolving from the right lobe of the thyroid.  No noted lymphadenopathy was reported.  Antithyroglobulin antibodies are pending at time of dictation.  Have ordered ultrasound-guided biopsy of thyroid mass to determine whether this is malignant or benign.  Return to clinic 1 week after biopsy to discuss the results.   Brain mass: MRI from January 07, 2021 reviewed independently and report as above with a 4.1 midline dural based mass with suspected invasion into the adjacent superior sagittal sinus.  Patient has been evaluated by neurosurgery and this is likely a meningioma.  She has not decided whether to pursue active surveillance or surgery.  She will call neurosurgery after work-up of her thyroid is complete.   Headaches: Patient does not complain of this today. Right shoulder pain:  Unclear etiology, but likely unrelated to thyroid or brain mass.  Patient will probably require orthopedics once work-up is complete.  Patient expressed understanding and was in agreement with this plan. She also understands that She can call clinic at any time with any questions, concerns, or complaints.    Cancer Staging  No matching staging information was found for the patient.  Lloyd Huger, MD   01/27/2021 11:03 AM

## 2021-01-27 ENCOUNTER — Inpatient Hospital Stay: Payer: BLUE CROSS/BLUE SHIELD

## 2021-01-27 ENCOUNTER — Encounter: Payer: Self-pay | Admitting: Oncology

## 2021-01-27 ENCOUNTER — Other Ambulatory Visit: Payer: Self-pay

## 2021-01-27 ENCOUNTER — Inpatient Hospital Stay: Payer: BLUE CROSS/BLUE SHIELD | Attending: Oncology | Admitting: Oncology

## 2021-01-27 DIAGNOSIS — G939 Disorder of brain, unspecified: Secondary | ICD-10-CM | POA: Insufficient documentation

## 2021-01-27 DIAGNOSIS — E079 Disorder of thyroid, unspecified: Secondary | ICD-10-CM | POA: Diagnosis not present

## 2021-01-27 DIAGNOSIS — G9389 Other specified disorders of brain: Secondary | ICD-10-CM | POA: Diagnosis not present

## 2021-01-27 DIAGNOSIS — M25511 Pain in right shoulder: Secondary | ICD-10-CM | POA: Insufficient documentation

## 2021-01-28 ENCOUNTER — Telehealth: Payer: Self-pay | Admitting: Emergency Medicine

## 2021-01-28 ENCOUNTER — Other Ambulatory Visit: Payer: Self-pay | Admitting: Emergency Medicine

## 2021-01-28 DIAGNOSIS — E079 Disorder of thyroid, unspecified: Secondary | ICD-10-CM

## 2021-01-28 LAB — THYROID PANEL WITH TSH
Free Thyroxine Index: 2.3 (ref 1.2–4.9)
T3 Uptake Ratio: 27 % (ref 24–39)
T4, Total: 8.5 ug/dL (ref 4.5–12.0)
TSH: 0.115 u[IU]/mL — ABNORMAL LOW (ref 0.450–4.500)

## 2021-01-28 NOTE — Telephone Encounter (Signed)
Pt informed of need for thyroid ultrasound prior to biopsy. Pt verbalized understanding.

## 2021-02-03 ENCOUNTER — Ambulatory Visit
Admission: RE | Admit: 2021-02-03 | Discharge: 2021-02-03 | Disposition: A | Discharge status: Home / Self Care | Payer: BLUE CROSS/BLUE SHIELD | Source: Ambulatory Visit | Attending: Oncology | Admitting: Oncology

## 2021-02-03 DIAGNOSIS — E079 Disorder of thyroid, unspecified: Secondary | ICD-10-CM | POA: Insufficient documentation

## 2021-02-03 LAB — THYROGLOBULIN LEVEL: Thyroglobulin: 42 ng/mL — ABNORMAL HIGH

## 2021-02-03 IMAGING — US US THYROID
1 series · 13 of 25 positions shown · non-contrast
Comparison: CT scan [DATE]

CLINICAL DATA: Incidental on CT. Thyroid mass seen on prior CT
scan.

EXAM:
THYROID ULTRASOUND
TECHNIQUE: Ultrasound examination of the thyroid gland and adjacent soft
tissues was performed.

[Series 1: us thyroid · 13 of 30 slices shown]
[im 1/30]
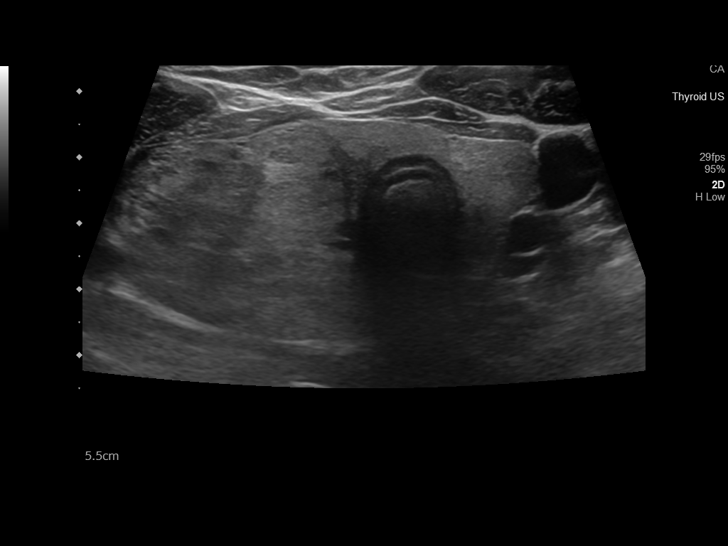
[im 3/30]
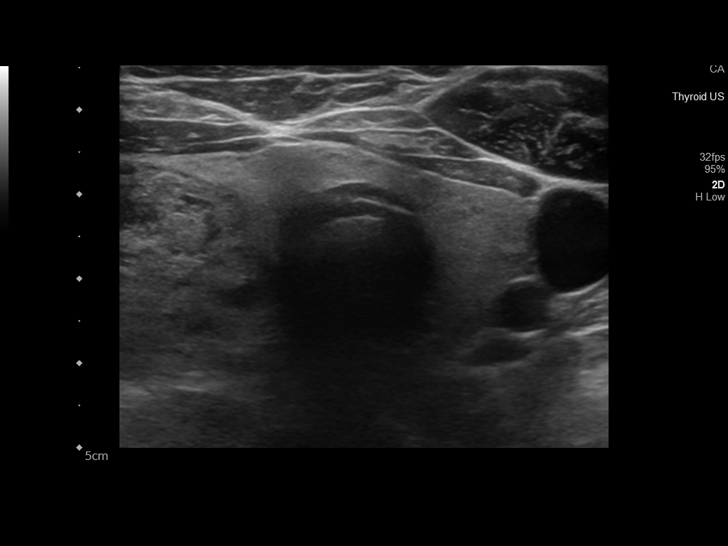
[im 5/30]
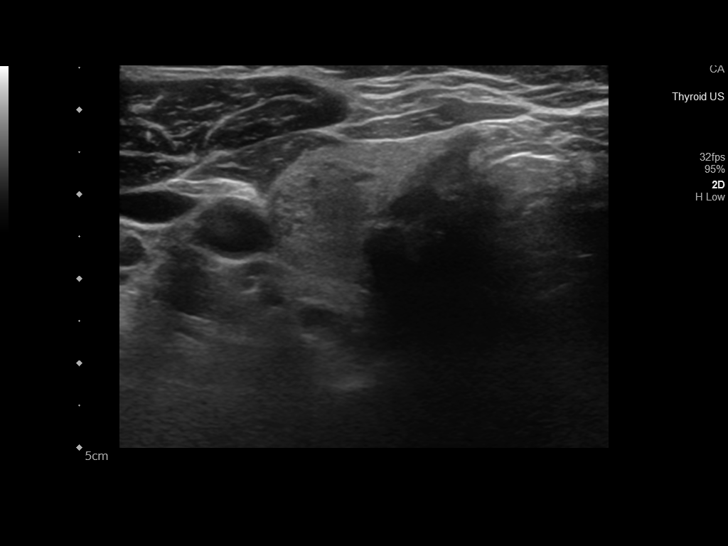
[im 8/30]
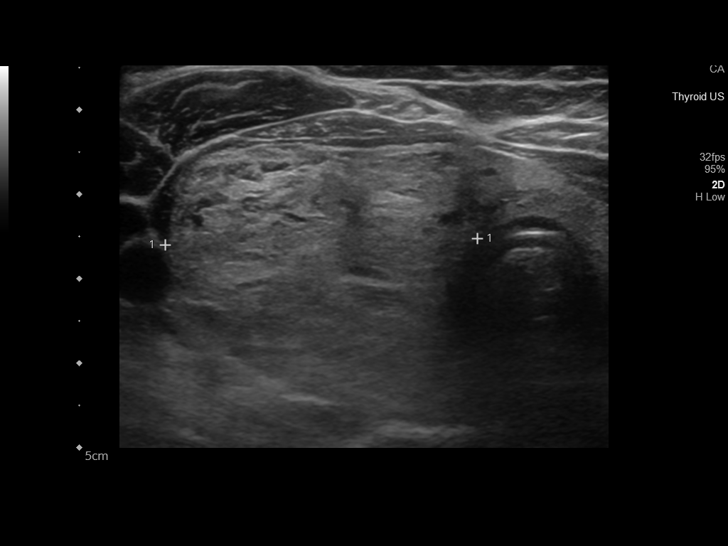
[im 10/30]
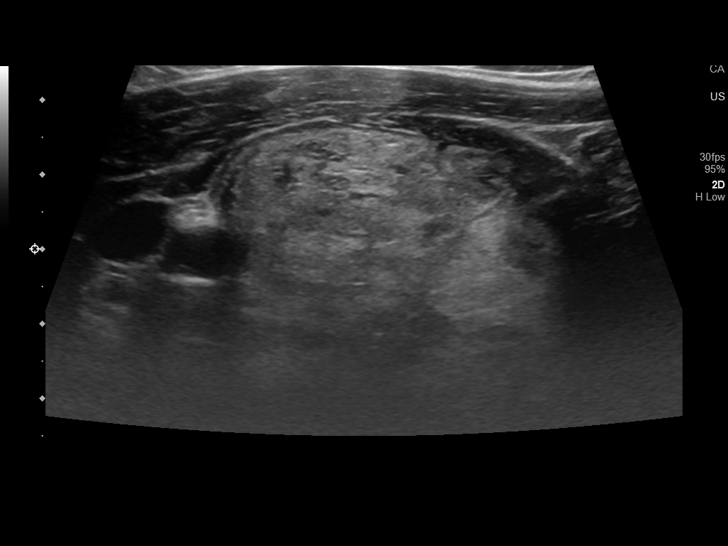
[im 13/30]
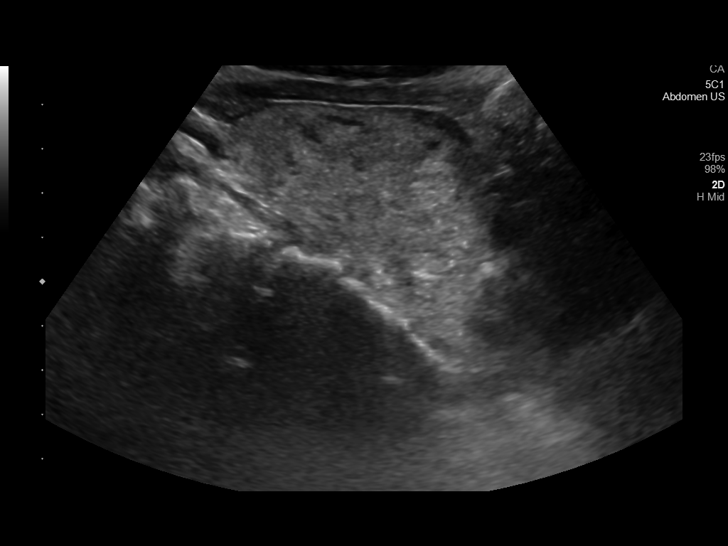
[im 15/30]
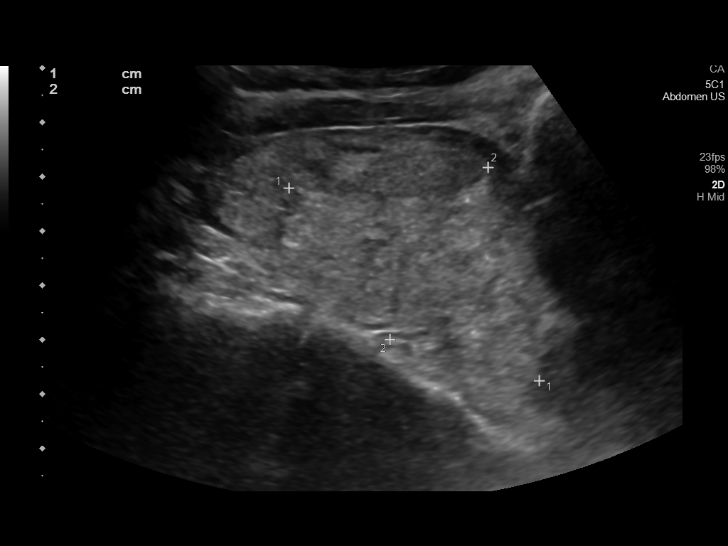
[im 17/30]
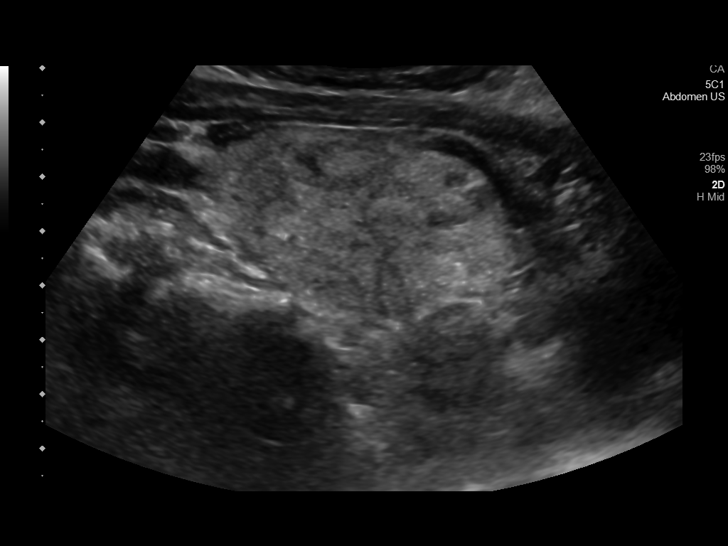
[im 20/30]
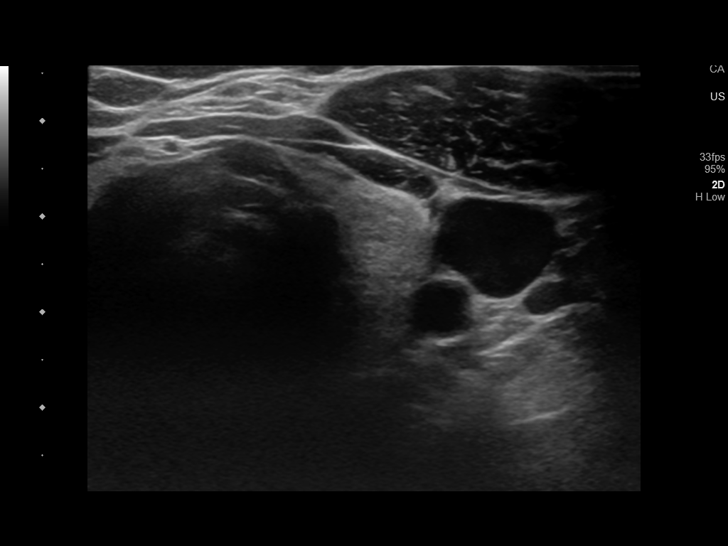
[im 22/30]
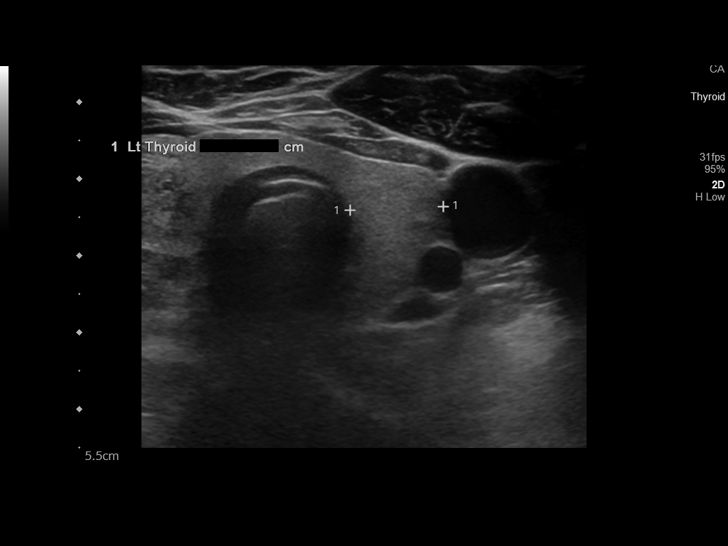
[im 25/30]
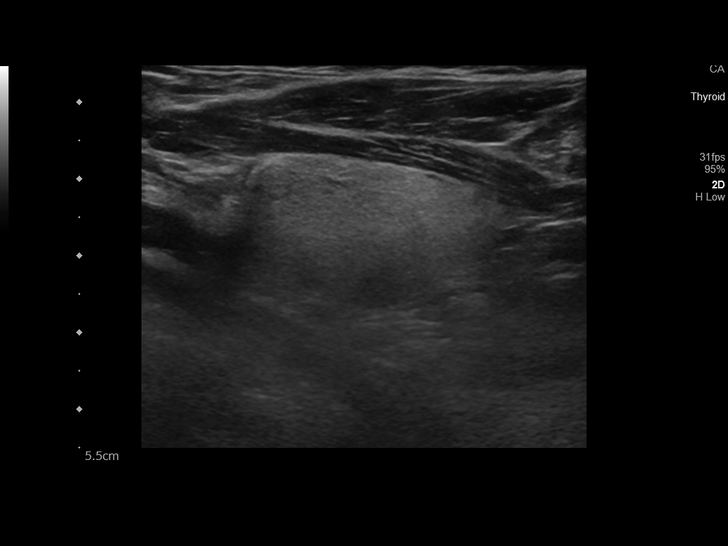
[im 27/30]
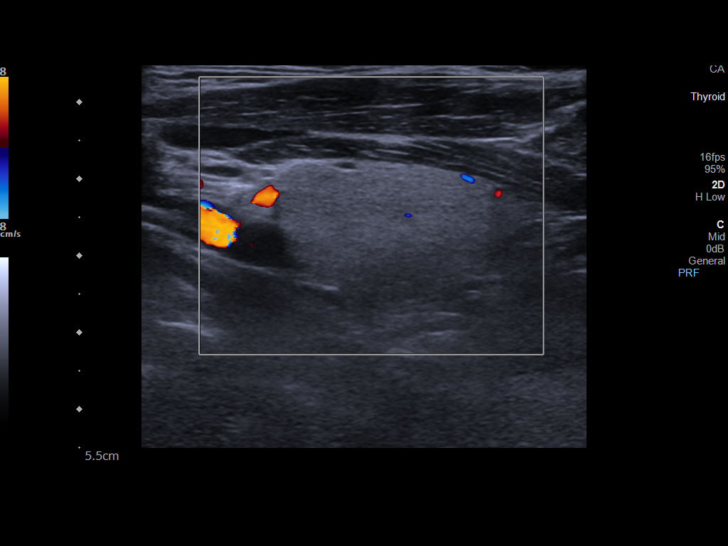
[im 30/30]
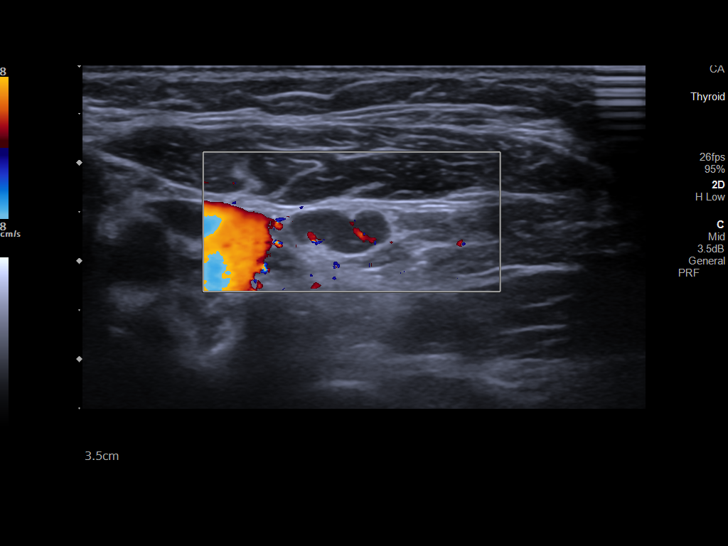

[13 of 25 positions shown; findings below may reference images not displayed]

FINDINGS: Parenchymal Echotexture: Mildly heterogenous

Isthmus: 0.5 cm

Right lobe: 7.4 x 4.1 x 3.7 cm

Left lobe: 3.3 x 1.9 x 1.2 cm

_________________________________________________________

Estimated total number of nodules >/= 1 cm: 1

Number of spongiform nodules >/=  2 cm not described below (TR1): 0

Number of mixed cystic and solid nodules >/= 1.5 cm not described
below (TR2): 0

_________________________________________________________

Nodule # 1:

Location: Right; Mid

Maximum size: 5.8 cm; Other 2 dimensions: 4.2 x 3.3 cm

Composition: solid/almost completely solid (2)

Echogenicity: isoechoic (1)

Shape: not taller-than-wide (0)

Margins: ill-defined (0)

Echogenic foci: none (0)

ACR TI-RADS total points: 3.

ACR TI-RADS risk category: TR3 (3 points).

ACR TI-RADS recommendations:

**Given size (>/= 2.5 cm) and appearance, fine needle aspiration of
this mildly suspicious nodule should be considered based on TI-RADS
criteria.

_________________________________________________________
IMPRESSION: Large nearly 6 cm TI-RADS category 3 nodule occupies nearly the
entirety of the right thyroid gland. This mass meets criteria to
consider fine-needle aspiration biopsy.

The above is in keeping with the ACR TI-RADS recommendations - [HOSPITAL] [P5];[DATE].

## 2021-02-04 ENCOUNTER — Encounter: Payer: Self-pay | Admitting: Emergency Medicine

## 2021-02-04 NOTE — Telephone Encounter (Addendum)
Spoke with patient about her scheduled ultrasound thyroid biopsy. She stated that she just found out that [facility] was now out of network effective the first of the year and she will be transferring care to [facility] as it is in-network. Biopsy cancelled per pt request. Dr Grayland Ormond made aware.

## 2021-02-06 ENCOUNTER — Ambulatory Visit: Payer: BLUE CROSS/BLUE SHIELD
